# Patient Record
Sex: Female | Born: 1960 | Race: White | Hispanic: No | Smoking: Never smoker
Health system: Southern US, Community
[De-identification: ages and names within clinical notes are randomized; demographics above are authoritative.]

## PROBLEM LIST (undated history)

## (undated) DIAGNOSIS — M1990 Unspecified osteoarthritis, unspecified site: Secondary | ICD-10-CM

## (undated) DIAGNOSIS — G4700 Insomnia, unspecified: Secondary | ICD-10-CM

## (undated) DIAGNOSIS — T7840XA Allergy, unspecified, initial encounter: Secondary | ICD-10-CM

## (undated) DIAGNOSIS — B029 Zoster without complications: Secondary | ICD-10-CM

## (undated) HISTORY — DX: Allergy, unspecified, initial encounter: T78.40XA

## (undated) HISTORY — DX: Zoster without complications: B02.9

## (undated) HISTORY — DX: Unspecified osteoarthritis, unspecified site: M19.90

## (undated) HISTORY — PX: TUBAL LIGATION: SHX77

## (undated) HISTORY — PX: BREAST SURGERY: SHX581

## (undated) HISTORY — DX: Insomnia, unspecified: G47.00

---

## 2002-06-14 ENCOUNTER — Ambulatory Visit (HOSPITAL_COMMUNITY): Admission: RE | Admit: 2002-06-14 | Discharge: 2002-06-14 | Payer: Self-pay | Admitting: Obstetrics and Gynecology

## 2011-01-19 ENCOUNTER — Other Ambulatory Visit: Payer: Self-pay | Admitting: Family Medicine

## 2011-01-19 DIAGNOSIS — M549 Dorsalgia, unspecified: Secondary | ICD-10-CM

## 2011-01-22 ENCOUNTER — Ambulatory Visit
Admission: RE | Admit: 2011-01-22 | Discharge: 2011-01-22 | Disposition: A | Discharge status: Home / Self Care | Payer: BC Managed Care – PPO | Source: Ambulatory Visit | Attending: Family Medicine | Admitting: Family Medicine

## 2011-01-22 DIAGNOSIS — M549 Dorsalgia, unspecified: Secondary | ICD-10-CM

## 2013-10-11 ENCOUNTER — Ambulatory Visit (INDEPENDENT_AMBULATORY_CARE_PROVIDER_SITE_OTHER): Payer: BC Managed Care – PPO | Admitting: Family Medicine

## 2013-10-11 VITALS — BP 130/84 | HR 83 | Temp 97.9°F | Resp 18 | Ht 69.0 in | Wt 152.0 lb

## 2013-10-11 DIAGNOSIS — R350 Frequency of micturition: Secondary | ICD-10-CM

## 2013-10-11 DIAGNOSIS — N390 Urinary tract infection, site not specified: Secondary | ICD-10-CM

## 2013-10-11 DIAGNOSIS — F418 Other specified anxiety disorders: Secondary | ICD-10-CM

## 2013-10-11 DIAGNOSIS — J329 Chronic sinusitis, unspecified: Secondary | ICD-10-CM

## 2013-10-11 DIAGNOSIS — K5900 Constipation, unspecified: Secondary | ICD-10-CM

## 2013-10-11 DIAGNOSIS — F411 Generalized anxiety disorder: Secondary | ICD-10-CM

## 2013-10-11 LAB — POCT URINALYSIS DIPSTICK
BILIRUBIN UA: NEGATIVE
GLUCOSE UA: NEGATIVE
Ketones, UA: NEGATIVE
NITRITE UA: NEGATIVE
RBC UA: NEGATIVE
Spec Grav, UA: 1.01
UROBILINOGEN UA: 0.2
pH, UA: 5.5

## 2013-10-11 LAB — POCT UA - MICROSCOPIC ONLY
CASTS, UR, LPF, POC: NEGATIVE
Crystals, Ur, HPF, POC: NEGATIVE
Mucus, UA: NEGATIVE
RBC, urine, microscopic: NEGATIVE
YEAST UA: NEGATIVE

## 2013-10-11 MED ORDER — LORAZEPAM 0.5 MG PO TABS: 0.5000 mg | ORAL_TABLET | Freq: Two times a day (BID) | ORAL | Status: DC | PRN

## 2013-10-11 MED ORDER — LEVOFLOXACIN 500 MG PO TABS: 500.0000 mg | ORAL_TABLET | Freq: Every day | ORAL | Status: DC

## 2013-10-11 MED ORDER — FLUCONAZOLE 150 MG PO TABS: 150.0000 mg | ORAL_TABLET | Freq: Once | ORAL | Status: DC

## 2013-10-11 MED ORDER — FLUTICASONE PROPIONATE 50 MCG/ACT NA SUSP: 2.0000 | Freq: Every day | NASAL | Status: DC

## 2013-10-11 NOTE — Progress Notes (Signed)
Subjective:  This chart was scribed for Delman Cheadle, MD by Ladene Artist, ED Scribe. The patient was seen in room 5. Patient's care was started at 9:03 AM.   Patient ID: Diane Dean, female    DOB: March 05, 1961, 53 y.o.   MRN: [id number]  Chief Complaint  Patient presents with   Bloated    x 2 weeks   Constipation    x 2 weeks   Urinary Frequency    x 2 weeks   HPI HPI Comments: Diane Dean is a 53 y.o. female who presents to the [facility] complaining of urinary frequency onset 2 weeks ago. Pt reports associated abdominal distention and gradually worsening constipation due to dehydration onset 2 weeks ago. She also reports chills that she attributes to menopause. She has tried Miralax and coffee to relieve constipation.   Pt reports recent elevated blood pressure and wishes to return for a physical.   Pt also reports environmental allergies. She has tried [facility] but states that she does not use it daily.   Pt reports slipped discs in her back with associated pain. Treatments include lumbar steroid injections at [facility]; last injection 1 month ago.   Last pap smear was 2 years ago by Dr. Benjie Karvonen on [place]. Pt reports ovarian cysts.   Past Medical History  Diagnosis Date   Allergy    No current outpatient prescriptions on file prior to visit.   No current facility-administered medications on file prior to visit.   Not on File  Review of Systems  Constitutional: Positive for chills. Negative for fever, fatigue and unexpected weight change.  Eyes: Negative for visual disturbance.  Respiratory: Negative for cough, chest tightness and shortness of breath.   Cardiovascular: Negative for chest pain, palpitations and leg swelling.  Gastrointestinal: Positive for constipation and abdominal distention. Negative for nausea, vomiting and diarrhea.  Genitourinary: Positive for frequency.  Musculoskeletal: Positive for back pain.  Skin: Negative for rash.    Allergic/Immunologic: Positive for environmental allergies.  Neurological: Negative for dizziness, syncope, weakness, light-headedness, numbness and headaches.   Triage Vitals: BP 130/84   Pulse 83   Temp(Src) 97.9 F (36.6 C) (Oral)   Resp 18   Ht 5\' 9"  (1.753 m)   Wt 152 lb (68.947 kg)   BMI 22.44 kg/m2   SpO2 100%    Objective:   Physical Exam  Nursing note and vitals reviewed. Constitutional: She is oriented to person, place, and time. She appears well-developed and well-nourished.  HENT:  Head: Normocephalic and atraumatic.  Right Ear: Tympanic membrane is not injected and not erythematous. A middle ear effusion (mild) is present.  Left Ear: Tympanic membrane is not injected and not erythematous. A middle ear effusion (mild) is present.  Nose: Mucosal edema and rhinorrhea present.  Mouth/Throat: Posterior oropharyngeal erythema present. No oropharyngeal exudate or posterior oropharyngeal edema.  Mild mid edema  Eyes: Conjunctivae and EOM are normal.  Neck: Neck supple.  Cardiovascular: Normal rate, regular rhythm, S1 normal, S2 normal and normal heart sounds.   No murmur heard. Pulmonary/Chest: Effort normal and breath sounds normal.  Abdominal: Bowel sounds are normal. She exhibits no mass. There is generalized tenderness. There is no guarding and negative Murphy's sign.  Musculoskeletal: Normal range of motion.  Lymphadenopathy:       Head (right side): Submental adenopathy present. No submandibular and no tonsillar adenopathy present.       Head (left side): Submental adenopathy present. No submandibular and no  tonsillar adenopathy present.    She has cervical adenopathy (anterior ).       Right: No supraclavicular adenopathy present.       Left: No supraclavicular adenopathy present.  Neurological: She is alert and oriented to person, place, and time.  Skin: Skin is warm and dry.  Psychiatric: She has a normal mood and affect. Her behavior is normal.   Results for orders  placed in visit on 10/11/13  POCT UA - MICROSCOPIC ONLY      Result Value Ref Range   WBC, Ur, HPF, POC tntc     RBC, urine, microscopic neg     Bacteria, U Microscopic trace     Mucus, UA neg     Epithelial cells, urine per micros 1-4     Crystals, Ur, HPF, POC neg     Casts, Ur, LPF, POC neg     Yeast, UA neg    POCT URINALYSIS DIPSTICK      Result Value Ref Range   Color, UA yellow     Clarity, UA clear     Glucose, UA neg     Bilirubin, UA neg     Ketones, UA neg     Spec Grav, UA 1.010     Blood, UA neg     pH, UA 5.5     Protein, UA trace     Urobilinogen, UA 0.2     Nitrite, UA neg     Leukocytes, UA moderate (2+)         Assessment & Plan:   Urinary frequency - Plan: POCT UA - Microscopic Only, POCT urinalysis dipstick  UTI (lower urinary tract infection) - Plan: Urine culture  Situational anxiety  Chronic sinusitis, unspecified location  Unspecified constipation  Meds ordered this encounter  Medications   levofloxacin (LEVAQUIN) 500 MG tablet    Sig: Take 1 tablet (500 mg total) by mouth daily.    Dispense:  7 tablet    Refill:  0   fluconazole (DIFLUCAN) 150 MG tablet    Sig: Take 1 tablet (150 mg total) by mouth once. After antibiotic course is complete. Repeat if needed after 3d    Dispense:  2 tablet    Refill:  0   LORazepam (ATIVAN) 0.5 MG tablet    Sig: Take 1-2 tablets (0.5-1 mg total) by mouth 2 (two) times daily as needed for anxiety.    Dispense:  10 tablet    Refill:  1   fluticasone (FLONASE) 50 MCG/ACT nasal spray    Sig: Place 2 sprays into both nostrils at bedtime.    Dispense:  16 g    Refill:  2    I personally performed the services described in this documentation, which was scribed in my presence. The recorded information has been reviewed and considered, and addended by me as needed.  Delman Cheadle, MD MPH

## 2013-10-11 NOTE — Patient Instructions (Addendum)

## 2013-10-13 LAB — URINE CULTURE

## 2013-10-25 ENCOUNTER — Telehealth: Payer: Self-pay

## 2013-10-25 DIAGNOSIS — R300 Dysuria: Secondary | ICD-10-CM

## 2013-10-25 NOTE — Telephone Encounter (Signed)
Spoke to pt, everything is almost back to normal, but still having mild symptoms.   Per dr Brigitte Pulse: labs only order for ua cx, and micro.  Pt aware, will come in tomorrow morning.

## 2013-10-25 NOTE — Telephone Encounter (Signed)
Pt was seen 10/11/13 and was prescribed an antibiotic for a bladder infection. She is still having issues and would like hear what dr. Brigitte Pulse reccomends.

## 2013-10-26 ENCOUNTER — Other Ambulatory Visit (INDEPENDENT_AMBULATORY_CARE_PROVIDER_SITE_OTHER): Payer: BC Managed Care – PPO | Admitting: Radiology

## 2013-10-26 DIAGNOSIS — R300 Dysuria: Secondary | ICD-10-CM

## 2013-10-26 LAB — POCT UA - MICROSCOPIC ONLY
BACTERIA, U MICROSCOPIC: NEGATIVE
CASTS, UR, LPF, POC: NEGATIVE
CRYSTALS, UR, HPF, POC: NEGATIVE
Mucus, UA: NEGATIVE
WBC, Ur, HPF, POC: NEGATIVE
Yeast, UA: NEGATIVE

## 2013-10-26 LAB — POCT URINALYSIS DIPSTICK
Bilirubin, UA: NEGATIVE
Blood, UA: NEGATIVE
GLUCOSE UA: NEGATIVE
Ketones, UA: NEGATIVE
Leukocytes, UA: NEGATIVE
Nitrite, UA: NEGATIVE
PROTEIN UA: NEGATIVE
SPEC GRAV UA: 1.01
UROBILINOGEN UA: 0.2
pH, UA: 7

## 2013-10-26 NOTE — Progress Notes (Signed)
Pt here for labs only. 

## 2013-10-27 ENCOUNTER — Telehealth: Payer: Self-pay

## 2013-10-27 LAB — URINE CULTURE

## 2013-10-27 NOTE — Telephone Encounter (Signed)
Patient called and left message on lab voicemail for labs.  Called patient, advised her urine culture is not back and we will call her when it comes back.  Patient states understanding and she is doing better.

## 2014-04-07 ENCOUNTER — Ambulatory Visit (INDEPENDENT_AMBULATORY_CARE_PROVIDER_SITE_OTHER): Payer: BC Managed Care – PPO | Admitting: Emergency Medicine

## 2014-04-07 VITALS — BP 118/84 | HR 93 | Temp 97.9°F | Resp 18 | Ht 69.25 in | Wt 154.8 lb

## 2014-04-07 DIAGNOSIS — J209 Acute bronchitis, unspecified: Secondary | ICD-10-CM

## 2014-04-07 MED ORDER — HYDROCOD POLST-CHLORPHEN POLST 10-8 MG/5ML PO LQCR: 5.0000 mL | Freq: Two times a day (BID) | ORAL | Status: DC | PRN

## 2014-04-07 MED ORDER — AZITHROMYCIN 250 MG PO TABS: ORAL_TABLET | ORAL | Status: DC

## 2014-04-07 NOTE — Progress Notes (Signed)
[facility] [address] [place]  [address] [phone]  Date:  04/07/2014   Name:  Diane Dean   DOB:  05-19-1960   MRN:  [id number]  PCP:  Delman Cheadle, MD    Chief Complaint: Cough; Sore Throat; and Referral   History of Present Illness:  Diane Dean is a 54 y.o. very pleasant female patient who presents with the following:  Ill for a week with cough and purulent sputum production. No wheezing or shortness of breath. No nasal congestion or post nasal drainage. No fever or chills No nausea or vomiting No stool change No rash No improvement with over the counter medications or other home remedies.  Denies other complaint or health concern today.   There are no active problems to display for this patient.   Past Medical History  Diagnosis Date  . Allergy     Past Surgical History  Procedure Laterality Date  . Tubal ligation      History  Substance Use Topics  . Smoking status: Never Smoker   . Smokeless tobacco: Not on file  . Alcohol Use: Not on file    Family History  Problem Relation Age of Onset  . Cancer Mother     No Known Allergies  Medication list has been reviewed and updated.  Current Outpatient Prescriptions on File Prior to Visit  Medication Sig Dispense Refill  . fluticasone (FLONASE) 50 MCG/ACT nasal spray Place 2 sprays into both nostrils at bedtime. 16 g 2  . LORazepam (ATIVAN) 0.5 MG tablet Take 1-2 tablets (0.5-1 mg total) by mouth 2 (two) times daily as needed for anxiety. (Patient not taking: Reported on 04/07/2014) 10 tablet 1   No current facility-administered medications on file prior to visit.    Review of Systems:  As per HPI, otherwise negative.    Physical Examination: Filed Vitals:   04/07/14 0836  BP: 118/84  Pulse: 93  Temp: 97.9 F (36.6 C)  Resp: 18   Filed Vitals:   04/07/14 0836  Height: 5' 9.25" (1.759 m)  Weight: 154 lb 12.8 oz (70.217 kg)   Body mass index is 22.69  kg/(m^2). Ideal Body Weight: Weight in (lb) to have BMI = 25: 170.2  GEN: WDWN, NAD, Non-toxic, A & O x 3 HEENT: Atraumatic, Normocephalic. Neck supple. No masses, No LAD. Ears and Nose: No external deformity. CV: RRR, No M/G/R. No JVD. No thrill. No extra heart sounds. PULM: CTA B, no wheezes, crackles, rhonchi. No retractions. No resp. distress. No accessory muscle use. ABD: S, NT, ND, +BS. No rebound. No HSM. EXTR: No c/c/e NEURO Normal gait.  PSYCH: Normally interactive. Conversant. Not depressed or anxious appearing.  Calm demeanor.    Assessment and Plan: Bronchitis tussionex zpak  Signed,  Ellison Carwin, MD

## 2014-04-07 NOTE — Patient Instructions (Signed)
bAcute Bronchitis Bronchitis is inflammation of the airways that extend from the windpipe into the lungs (bronchi). The inflammation often causes mucus to develop. This leads to a cough, which is the most common symptom of bronchitis.  In acute bronchitis, the condition usually develops suddenly and goes away over time, usually in a couple weeks. Smoking, allergies, and asthma can make bronchitis worse. Repeated episodes of bronchitis may cause further lung problems.  CAUSES Acute bronchitis is most often caused by the same virus that causes a cold. The virus can spread from person to person (contagious) through coughing, sneezing, and touching contaminated objects. SIGNS AND SYMPTOMS   Cough.   Fever.   Coughing up mucus.   Body aches.   Chest congestion.   Chills.   Shortness of breath.   Sore throat.  DIAGNOSIS  Acute bronchitis is usually diagnosed through a physical exam. Your health care provider will also ask you questions about your medical history. Tests, such as chest X-rays, are sometimes done to rule out other conditions.  TREATMENT  Acute bronchitis usually goes away in a couple weeks. Oftentimes, no medical treatment is necessary. Medicines are sometimes given for relief of fever or cough. Antibiotic medicines are usually not needed but may be prescribed in certain situations. In some cases, an inhaler may be recommended to help reduce shortness of breath and control the cough. A cool mist vaporizer may also be used to help thin bronchial secretions and make it easier to clear the chest.  HOME CARE INSTRUCTIONS  Get plenty of rest.   Drink enough fluids to keep your urine clear or pale yellow (unless you have a medical condition that requires fluid restriction). Increasing fluids may help thin your respiratory secretions (sputum) and reduce chest congestion, and it will prevent dehydration.   Take medicines only as directed by your health care provider.  If  you were prescribed an antibiotic medicine, finish it all even if you start to feel better.  Avoid smoking and secondhand smoke. Exposure to cigarette smoke or irritating chemicals will make bronchitis worse. If you are a smoker, consider using nicotine gum or skin patches to help control withdrawal symptoms. Quitting smoking will help your lungs heal faster.   Reduce the chances of another bout of acute bronchitis by washing your hands frequently, avoiding people with cold symptoms, and trying not to touch your hands to your mouth, nose, or eyes.   Keep all follow-up visits as directed by your health care provider.  SEEK MEDICAL CARE IF: Your symptoms do not improve after 1 week of treatment.  SEEK IMMEDIATE MEDICAL CARE IF:  You develop an increased fever or chills.   You have chest pain.   You have severe shortness of breath.  You have bloody sputum.   You develop dehydration.  You faint or repeatedly feel like you are going to pass out.  You develop repeated vomiting.  You develop a severe headache. MAKE SURE YOU:   Understand these instructions.  Will watch your condition.  Will get help right away if you are not doing well or get worse. Document Released: 04/22/2004 Document Revised: 07/30/2013 Document Reviewed: 09/05/2012 [facility] Patient Information 2015 [place], [place]. This information is not intended to replace advice given to you by your health care provider. Make sure you discuss any questions you have with your health care provider.

## 2014-07-13 DIAGNOSIS — D126 Benign neoplasm of colon, unspecified: Secondary | ICD-10-CM | POA: Insufficient documentation

## 2014-08-08 ENCOUNTER — Ambulatory Visit (INDEPENDENT_AMBULATORY_CARE_PROVIDER_SITE_OTHER): Payer: BC Managed Care – PPO | Admitting: Family Medicine

## 2014-08-08 ENCOUNTER — Telehealth: Payer: Self-pay

## 2014-08-08 VITALS — BP 122/80 | HR 90 | Temp 98.2°F | Resp 18 | Ht 68.0 in | Wt 147.0 lb

## 2014-08-08 DIAGNOSIS — M1990 Unspecified osteoarthritis, unspecified site: Secondary | ICD-10-CM | POA: Diagnosis not present

## 2014-08-08 DIAGNOSIS — J309 Allergic rhinitis, unspecified: Secondary | ICD-10-CM | POA: Diagnosis not present

## 2014-08-08 DIAGNOSIS — K5900 Constipation, unspecified: Secondary | ICD-10-CM

## 2014-08-08 DIAGNOSIS — R5383 Other fatigue: Secondary | ICD-10-CM

## 2014-08-08 DIAGNOSIS — Z113 Encounter for screening for infections with a predominantly sexual mode of transmission: Secondary | ICD-10-CM

## 2014-08-08 DIAGNOSIS — Z1239 Encounter for other screening for malignant neoplasm of breast: Secondary | ICD-10-CM | POA: Diagnosis not present

## 2014-08-08 DIAGNOSIS — G4700 Insomnia, unspecified: Secondary | ICD-10-CM

## 2014-08-08 DIAGNOSIS — R300 Dysuria: Secondary | ICD-10-CM | POA: Diagnosis not present

## 2014-08-08 DIAGNOSIS — Z1211 Encounter for screening for malignant neoplasm of colon: Secondary | ICD-10-CM | POA: Diagnosis not present

## 2014-08-08 DIAGNOSIS — K5909 Other constipation: Secondary | ICD-10-CM

## 2014-08-08 LAB — POCT URINALYSIS DIPSTICK
BILIRUBIN UA: NEGATIVE
Blood, UA: NEGATIVE
Glucose, UA: NEGATIVE
Ketones, UA: NEGATIVE
LEUKOCYTES UA: NEGATIVE
NITRITE UA: NEGATIVE
Protein, UA: NEGATIVE
Spec Grav, UA: 1.01
UROBILINOGEN UA: 0.2
pH, UA: 7

## 2014-08-08 LAB — COMPREHENSIVE METABOLIC PANEL
ALBUMIN: 4.4 g/dL (ref 3.5–5.2)
ALK PHOS: 77 U/L (ref 39–117)
ALT: 26 U/L (ref 0–35)
AST: 22 U/L (ref 0–37)
BILIRUBIN TOTAL: 0.5 mg/dL (ref 0.2–1.2)
BUN: 8 mg/dL (ref 6–23)
CO2: 21 mEq/L (ref 19–32)
CREATININE: 0.76 mg/dL (ref 0.50–1.10)
Calcium: 9.8 mg/dL (ref 8.4–10.5)
Chloride: 100 mEq/L (ref 96–112)
GLUCOSE: 76 mg/dL (ref 70–99)
Potassium: 4.3 mEq/L (ref 3.5–5.3)
Sodium: 139 mEq/L (ref 135–145)
Total Protein: 7 g/dL (ref 6.0–8.3)

## 2014-08-08 LAB — LIPID PANEL
CHOLESTEROL: 236 mg/dL — AB (ref 0–200)
HDL: 131 mg/dL (ref >=46)
LDL Cholesterol: 89 mg/dL (ref 0–99)
Total CHOL/HDL Ratio: 1.8 Ratio
Triglycerides: 81 mg/dL (ref <150)
VLDL: 16 mg/dL (ref 0–40)

## 2014-08-08 LAB — POCT CBC
Granulocyte percent: 63.7 %G (ref 37–80)
HEMATOCRIT: 44.1 % (ref 37.7–47.9)
Hemoglobin: 14.2 g/dL (ref 12.2–16.2)
Lymph, poc: 2.1 (ref 0.6–3.4)
MCH: 30.7 pg (ref 27–31.2)
MCHC: 32.1 g/dL (ref 31.8–35.4)
MCV: 95.7 fL (ref 80–97)
MID (cbc): 0.5 (ref 0–0.9)
MPV: 8.2 fL (ref 0–99.8)
POC Granulocyte: 4.5 (ref 2–6.9)
POC LYMPH %: 29.8 % (ref 10–50)
POC MID %: 6.5 %M (ref 0–12)
Platelet Count, POC: 250 10*3/uL (ref 142–424)
RBC: 4.61 M/uL (ref 4.04–5.48)
RDW, POC: 17 %
WBC: 7 10*3/uL (ref 4.6–10.2)

## 2014-08-08 LAB — THYROID PANEL WITH TSH
FREE THYROXINE INDEX: 1.5 (ref 1.4–3.8)
T3 Uptake: 28 % (ref 22–35)
T4, Total: 5.3 ug/dL (ref 4.5–12.0)
TSH: 3.144 u[IU]/mL (ref 0.350–4.500)

## 2014-08-08 LAB — HIV ANTIBODY (ROUTINE TESTING W REFLEX): HIV 1&2 Ab, 4th Generation: NONREACTIVE

## 2014-08-08 LAB — POCT UA - MICROSCOPIC ONLY
Bacteria, U Microscopic: NEGATIVE
CRYSTALS, UR, HPF, POC: NEGATIVE
Casts, Ur, LPF, POC: NEGATIVE
Epithelial cells, urine per micros: NEGATIVE
Mucus, UA: NEGATIVE
RBC, urine, microscopic: NEGATIVE
WBC, Ur, HPF, POC: NEGATIVE
YEAST UA: NEGATIVE

## 2014-08-08 LAB — POCT SEDIMENTATION RATE: POCT SED RATE: 125 mm/hr — AB (ref 0–22)

## 2014-08-08 LAB — HEPATITIS C ANTIBODY: HCV AB: NEGATIVE

## 2014-08-08 LAB — VITAMIN B12: Vitamin B-12: 362 pg/mL (ref 211–911)

## 2014-08-08 MED ORDER — AZELASTINE HCL 0.15 % NA SOLN: 2.0000 | Freq: Every day | NASAL | Status: DC

## 2014-08-08 MED ORDER — ALPRAZOLAM 0.5 MG PO TABS: 0.5000 mg | ORAL_TABLET | Freq: Every evening | ORAL | Status: DC | PRN

## 2014-08-08 MED ORDER — LINACLOTIDE 145 MCG PO CAPS: 145.0000 ug | ORAL_CAPSULE | Freq: Every day | ORAL | Status: DC

## 2014-08-08 NOTE — Telephone Encounter (Signed)
Notified pharmacist of this.

## 2014-08-08 NOTE — Telephone Encounter (Signed)
Pharmacist called from [facility] asking about the Xanax prescription. He wants to clarify 90 day supply for Xanax or did you mean 30?  ALPRAZolam (XANAX) 0.5 MG tablet [[id number]]      Order Details    Dose: 0.5 mg Route: Oral Frequency: At bedtime PRN for anxiety   Dispense Quantity:  90 tablet Refills:  1 Fills Remaining:  1          Sig: Take 1 tablet (0.5 mg total) by mouth at bedtime as needed for anxiety.         Written Date:  08/08/14 Expiration Date:  02/04/15     Start Date:  08/08/14 End Date:  --     Ordering Provider:  -- Authorizing Provider:  Shawnee Knapp, MD Ordering User:  Shawnee Knapp, MD

## 2014-08-08 NOTE — Patient Instructions (Addendum)
Constipation  Constipation is when a person has fewer than three bowel movements a week, has difficulty having a bowel movement, or has stools that are dry, hard, or larger than normal. As people grow older, constipation is more common. If you try to fix constipation with medicines that make you have a bowel movement (laxatives), the problem may get worse. Long-term laxative use may cause the muscles of the colon to become weak. A low-fiber diet, not taking in enough fluids, and taking certain medicines may make constipation worse.   CAUSES   · Certain medicines, such as antidepressants, pain medicine, iron supplements, antacids, and water pills.    · Certain diseases, such as diabetes, irritable bowel syndrome (IBS), thyroid disease, or depression.    · Not drinking enough water.    · Not eating enough fiber-rich foods.    · Stress or travel.    · Lack of physical activity or exercise.    · Ignoring the urge to have a bowel movement.    · Using laxatives too much.    SIGNS AND SYMPTOMS   · Having fewer than three bowel movements a week.    · Straining to have a bowel movement.    · Having stools that are hard, dry, or larger than normal.    · Feeling full or bloated.    · Pain in the lower abdomen.    · Not feeling relief after having a bowel movement.    DIAGNOSIS   Your health care provider will take a medical history and perform a physical exam. Further testing may be done for severe constipation. Some tests may include:  · A barium enema X-ray to examine your rectum, colon, and, sometimes, your small intestine.    · A sigmoidoscopy to examine your lower colon.    · A colonoscopy to examine your entire colon.  TREATMENT   Treatment will depend on the severity of your constipation and what is causing it. Some dietary treatments include drinking more fluids and eating more fiber-rich foods. Lifestyle treatments may include regular exercise. If these diet and lifestyle recommendations do not help, your health care  provider may recommend taking over-the-counter laxative medicines to help you have bowel movements. Prescription medicines may be prescribed if over-the-counter medicines do not work.   HOME CARE INSTRUCTIONS   · Eat foods that have a lot of fiber, such as fruits, vegetables, whole grains, and beans.  · Limit foods high in fat and processed sugars, such as french fries, hamburgers, cookies, candies, and soda.    · A fiber supplement may be added to your diet if you cannot get enough fiber from foods.    · Drink enough fluids to keep your urine clear or pale yellow.    · Exercise regularly or as directed by your health care provider.    · Go to the restroom when you have the urge to go. Do not hold it.    · Only take over-the-counter or prescription medicines as directed by your health care provider. Do not take other medicines for constipation without talking to your health care provider first.    SEEK IMMEDIATE MEDICAL CARE IF:   · You have bright red blood in your stool.    · Your constipation lasts for more than 4 days or gets worse.    · You have abdominal or rectal pain.    · You have thin, pencil-like stools.    · You have unexplained weight loss.  MAKE SURE YOU:   · Understand these instructions.  · Will watch your condition.  · Will get help right away if you are not   you have with your health care provider. About Constipation  Constipation Overview Constipation is the most common gastrointestinal complaint - about 4 million Americans experience constipation and make 2.5 million physician visits a year to get help for the problem.  Constipation can occur when the colon absorbs too much water, the colon's muscle  contraction is slow or sluggish, and/or there is delayed transit time through the colon.  The result is stool that is hard and dry.  Indicators of constipation include straining during bowel movements greater than 25% of the time, having fewer than three bowel movements per week, and/or the feeling of incomplete evacuation.  There are established guidelines ([place] II ) for defining constipation. A person needs to have two or more of the following symptoms for at least 12 weeks (not necessarily consecutive) in the preceding 12 months: . Straining in  greater than 25% of bowel movements . Lumpy or hard stools in greater than 25% of bowel movements . Sensation of incomplete emptying in greater than 25% of bowel movements . Sensation of anorectal obstruction/blockade in greater than 25% of bowel movements . Manual maneuvers to help empty greater than 25% of bowel movements (e.g., digital evacuation, support of the pelvic floor)  . Less than  3 bowel movements/week . Loose stools are not present, and criteria for irritable bowel syndrome are insufficient  Common Causes of Constipation . Lack of fiber in your diet . Lack of physical activity . Medications, including iron and calcium supplements  . Dairy intake . Dehydration . Abuse of laxatives  Travel  Irritable Bowel Syndrome  Pregnancy  Luteal phase of menstruation (after ovulation and before menses)  Colorectal problems  Intestinal Dysfunction  Treating Constipation  There are several ways of treating constipation, including changes to diet and exercise, use of laxatives, adjustments to the pelvic floor, and scheduled toileting.  These treatments include: . increasing fiber and fluids in the diet  . increasing physical activity . learning muscle coordination   learning proper toileting techniques and toileting modifications   designing and sticking  to a toileting schedule     2007, [employer] Doc.22

## 2014-08-08 NOTE — Telephone Encounter (Signed)
i meant 90day. If [name] only wants 30 days at a time then that is fine - he is welcome to dispense only a 30d supply if mandated by insurance or pt preference.

## 2014-08-08 NOTE — Progress Notes (Signed)
Subjective:  This chart was scribed for Delman Cheadle MD, by Tamsen Roers, at [facility].  This patient was seen in room 10 and the patient's care was started at 8:38 AM.    Patient ID: Diane Dean, female    DOB: Feb 18, 1961, 54 y.o.   MRN: [id number] Chief Complaint  Patient presents with  . Constipation    on going issue   . ref for colon screen    [facility]  . Stress  . Insomnia     HPI   HPI Comments: Diane Dean is a 54 y.o. female who presents to the [facility] complaining of constipation, stress, and a toe nail fungus.  She does not want to take the muscle relaxor for her back anymore because it is too strong for her.  She also is due for a mammogram which she would like to get a referral for (at [facility]).   Patient states that she lost a lot of weight by quitting drinking beer.  She has not had a period in two years.   Patients husband passed away two years ago.  Patient was able to be sexually active after two years and thinks she may have gotten a urinary tract infection afterwards but denies any discharge or vaginal bleeding at that time or currently.     Constipation: Patient drinks coffee every morning but states that she has been very constipated recently,  and feels like nothing will work for her anymore. She had diarrhea three weeks ago for the first time in 3 years and has been constipated ever since.  She feels like her constipation is due to the stress.  She would like a referral for a colonoscopy at [facility]. Her mother died of colon cancer.    Stress: She states that she would like a prescription for Xanax today. She has not traveled recently but went to [place] in January.    Toe nail fungus: She is complaining of a right big toe nail fungus.    Allergies: Patient is allergic to cats but states that she has 2 at home and they sleep in close proximity to her.  She takes two Advil Cold and Sinus every morning and has not had  any sinus infections since. She is also uses her Flonase but doesn't think it works well for her.  --------------------- Last time I saw her, she was having constipation issues.  She was using Murelax and coffee  Most of her specialists are at [facility].  She is getting lumbar spinal nerve injections for slipped discs and pain. Her gynecologist is Dr. Benjie Karvonen on [address]. When she came in last year I gave her a small amount of low dose Ativan to help with claustrophobia and anxiety on flights.  She was seen last for injection at [facility] June 19, 2014 by Dr. Sheffield Slider for spondylosis of the lumbar region.  BP was 144/90.  They gave her Zanaflex 2 mg to sleep and for severe back pain.  She has not seen anyone other than orthopedics at [facility] for the past 3 years. She did not have any labs completed there.     Past Medical History  Diagnosis Date  . Allergy    Current Outpatient Prescriptions on File Prior to Visit  Medication Sig Dispense Refill  . azithromycin (ZITHROMAX) 250 MG tablet Take 2 tabs PO x 1 dose, then 1 tab PO QD x 4 days (Patient not taking: Reported on 08/08/2014) 6  tablet 0  . chlorpheniramine-HYDROcodone (TUSSIONEX PENNKINETIC ER) 10-8 MG/5ML LQCR Take 5 mLs by mouth every 12 (twelve) hours as needed. (Patient not taking: Reported on 08/08/2014) 60 mL 0  . fluticasone (FLONASE) 50 MCG/ACT nasal spray Place 2 sprays into both nostrils at bedtime. (Patient not taking: Reported on 08/08/2014) 16 g 2  . LORazepam (ATIVAN) 0.5 MG tablet Take 1-2 tablets (0.5-1 mg total) by mouth 2 (two) times daily as needed for anxiety. (Patient not taking: Reported on 04/07/2014) 10 tablet 1   No current facility-administered medications on file prior to visit.    No Known Allergies   Review of Systems  Constitutional: Negative for fever and chills.  Gastrointestinal: Positive for constipation. Negative for nausea, vomiting and abdominal pain.  Genitourinary: Negative for vaginal bleeding, vaginal discharge  and vaginal pain.  Psychiatric/Behavioral: Positive for sleep disturbance.       Objective:   Physical Exam  Constitutional: She appears well-developed and well-nourished. No distress.  HENT:  Head: Normocephalic and atraumatic.  Eyes: Right eye exhibits no discharge. Left eye exhibits no discharge.  Neck: No thyromegaly present.  Cardiovascular: Normal rate, regular rhythm, S1 normal, S2 normal and normal heart sounds.  Exam reveals no friction rub.   No murmur heard. Pulmonary/Chest: Effort normal and breath sounds normal. No respiratory distress. She has no wheezes. She has no rales.  Lymphadenopathy:    She has no cervical adenopathy.  Skin: No rash noted. She is not diaphoretic.  Psychiatric: She has a normal mood and affect. Her behavior is normal.  Nursing note and vitals reviewed.  Filed Vitals:   08/08/14 0810  BP: 122/80  Pulse: 90  Temp: 98.2 F (36.8 C)  TempSrc: Oral  Resp: 18  Height: 5\' 8"  (1.727 m)  Weight: 147 lb (66.679 kg)  SpO2: 98%         Assessment & Plan:   1. Chronic constipation   2. Dysuria   3. Allergic rhinitis, unspecified allergic rhinitis type   4. Insomnia   5. Other fatigue   6. Screening for STD (sexually transmitted disease)   7. Special screening for malignant neoplasms, colon   8. Screening for breast cancer   9. Arthritis     Orders Placed This Encounter  Procedures  . Urine culture  . MM Digital Screening    Standing Status: Future     Number of Occurrences:      Standing Expiration Date: 10/08/2015    Scheduling Instructions:     Would like to be done at [facility] - same day as GI appt would be great if possible    Order Specific Question:  Reason for Exam (SYMPTOM  OR DIAGNOSIS REQUIRED)    Answer:  screening    Order Specific Question:  Is the patient pregnant?    Answer:  No    Order Specific Question:  Preferred imaging location?    Answer:  External  . Comprehensive metabolic panel    Order Specific Question:   Has the patient fasted?    Answer:  Yes  . Lipid panel    Order Specific Question:  Has the patient fasted?    Answer:  Yes  . Vitamin B12  . Vit D  25 hydroxy (rtn osteoporosis monitoring)  . Hepatitis C antibody  . HIV antibody  . Thyroid Panel With TSH  . C-reactive protein  . Rheumatoid factor  . Cyclic citrul peptide antibody, IgG  . Ambulatory referral to Gastroenterology    Referral Priority:  Routine    Referral Type:  Consultation    Referral Reason:  Specialty Services Required    Requested Specialty:  Gastroenterology    Number of Visits Requested:  1  . POCT CBC  . POCT UA - Microscopic Only  . POCT urinalysis dipstick  . POCT SEDIMENTATION RATE    Meds ordered this encounter  Medications  . ALPRAZolam (XANAX) 0.5 MG tablet    Sig: Take 1 tablet (0.5 mg total) by mouth at bedtime as needed for anxiety.    Dispense:  90 tablet    Refill:  1  . Linaclotide (LINZESS) 145 MCG CAPS capsule    Sig: Take 1 capsule (145 mcg total) by mouth daily.    Dispense:  30 capsule    Refill:  11  . Azelastine HCl 0.15 % SOLN    Sig: Place 2 sprays into the nose daily.    Dispense:  30 mL    Refill:  2    I personally performed the services described in this documentation, which was scribed in my presence. The recorded information has been reviewed and considered, and addended by me as needed.  Delman Cheadle, MD MPH

## 2014-08-09 LAB — RHEUMATOID FACTOR: Rhuematoid fact SerPl-aCnc: <10 IU/mL (ref <=14)

## 2014-08-09 LAB — URINE CULTURE: Colony Count: 15000

## 2014-08-09 LAB — CYCLIC CITRUL PEPTIDE ANTIBODY, IGG: Cyclic Citrullin Peptide Ab: <2 U/mL (ref 0.0–5.0)

## 2014-08-09 LAB — C-REACTIVE PROTEIN

## 2014-08-09 LAB — VITAMIN D 25 HYDROXY (VIT D DEFICIENCY, FRACTURES): VIT D 25 HYDROXY: 42 ng/mL (ref 30–100)

## 2014-08-15 ENCOUNTER — Encounter: Payer: Self-pay | Admitting: Family Medicine

## 2014-08-15 DIAGNOSIS — R700 Elevated erythrocyte sedimentation rate: Secondary | ICD-10-CM

## 2014-08-15 DIAGNOSIS — IMO0001 Reserved for inherently not codable concepts without codable children: Secondary | ICD-10-CM

## 2014-11-18 ENCOUNTER — Encounter: Payer: Self-pay | Admitting: *Deleted

## 2015-01-31 ENCOUNTER — Other Ambulatory Visit: Payer: Self-pay | Admitting: Family Medicine

## 2015-02-02 ENCOUNTER — Encounter: Payer: Self-pay | Admitting: Family Medicine

## 2015-02-03 LAB — HM COLONOSCOPY

## 2015-02-04 ENCOUNTER — Encounter: Payer: Self-pay | Admitting: Family Medicine

## 2015-02-12 ENCOUNTER — Other Ambulatory Visit (INDEPENDENT_AMBULATORY_CARE_PROVIDER_SITE_OTHER): Payer: BC Managed Care – PPO

## 2015-02-12 DIAGNOSIS — Q998 Other specified chromosome abnormalities: Secondary | ICD-10-CM | POA: Diagnosis not present

## 2015-02-12 DIAGNOSIS — IMO0001 Reserved for inherently not codable concepts without codable children: Secondary | ICD-10-CM

## 2015-02-12 DIAGNOSIS — R700 Elevated erythrocyte sedimentation rate: Secondary | ICD-10-CM

## 2015-02-12 LAB — C-REACTIVE PROTEIN

## 2015-02-13 LAB — PROCALCITONIN: Procalcitonin: <0.1 ng/mL (ref <0.10)

## 2015-02-13 LAB — ANA: Anti Nuclear Antibody(ANA): NEGATIVE

## 2015-02-13 LAB — SEDIMENTATION RATE: Sed Rate: 4 mm/hr (ref 0–30)

## 2015-02-26 ENCOUNTER — Encounter: Payer: Self-pay | Admitting: Family Medicine

## 2015-06-13 ENCOUNTER — Ambulatory Visit (INDEPENDENT_AMBULATORY_CARE_PROVIDER_SITE_OTHER): Payer: BC Managed Care – PPO | Admitting: Physician Assistant

## 2015-06-13 VITALS — BP 132/88 | HR 89 | Temp 98.0°F | Resp 18 | Ht 68.5 in | Wt 136.0 lb

## 2015-06-13 DIAGNOSIS — J209 Acute bronchitis, unspecified: Secondary | ICD-10-CM | POA: Diagnosis not present

## 2015-06-13 MED ORDER — HYDROCOD POLST-CPM POLST ER 10-8 MG/5ML PO SUER: 5.0000 mL | Freq: Every evening | ORAL | Status: DC | PRN

## 2015-06-13 MED ORDER — GUAIFENESIN ER 1200 MG PO TB12: 1.0000 | ORAL_TABLET | Freq: Two times a day (BID) | ORAL | Status: DC | PRN

## 2015-06-13 MED ORDER — DOXYCYCLINE HYCLATE 100 MG PO CAPS: 100.0000 mg | ORAL_CAPSULE | Freq: Two times a day (BID) | ORAL | Status: AC

## 2015-06-13 MED ORDER — PSEUDOEPHEDRINE HCL 60 MG PO TABS: 60.0000 mg | ORAL_TABLET | Freq: Four times a day (QID) | ORAL | Status: DC | PRN

## 2015-06-13 NOTE — Progress Notes (Signed)
[facility] [address] [place]  [phone]  Date:  06/13/2015   Name:  Diane Dean   DOB:  [birth year]   MRN:  [id number]  PCP:  Delman Cheadle, MD   Chief Complaint  Patient presents with  . Nasal Congestion    x 1 week  . Cough    leading to rib pain  . Sore Throat  . Ear Pain     History of Present Illness:  Diane Dean is a 55 y.o. female patient who presents to [facility] for cc of nasal congestion, cough, sore throat, and otalgia.  Her symptoms began 1 week ago, nasal congestion and hard cough.  Her left ear has been painful since 3 months ago.   She was given zpak 2.5 months which did not help.  She has body aches and chills.  Her throat is sore.  She took advil cold and sinus.   She is coughing productive yellow sputum which has since resolved.  She has no trouble with her breathing.  Yesterday, she took her temperature at 99.5.       There are no active problems to display for this patient.   Past Medical History  Diagnosis Date  . Allergy     Past Surgical History  Procedure Laterality Date  . Tubal ligation    . Breast surgery      Social History  Substance Use Topics  . Smoking status: Never Smoker   . Smokeless tobacco: None  . Alcohol Use: None    Family History  Problem Relation Age of Onset  . Cancer Mother     No Known Allergies  Medication list has been reviewed and updated.  Current Outpatient Prescriptions on File Prior to Visit  Medication Sig Dispense Refill  . ALPRAZolam (XANAX) 0.5 MG tablet TAKE 1 TABLET BY MOUTH EVERY NIGHT AT BEDTIME AS NEEDED FOR ANXIETY 90 tablet 0  . Azelastine HCl 0.15 % SOLN Place 2 sprays into the nose daily. 30 mL 2   No current facility-administered medications on file prior to visit.    ROS ROS otherwise unremarkable unless listed above.   Physical Examination: BP 132/88 mmHg  Pulse 89  Temp(Src) 98 F (36.7 C)  Resp 18  Ht 5' 8.5" (1.74 m)  Wt 136 lb  (61.689 kg)  BMI 20.38 kg/m2  SpO2 98% Ideal Body Weight: Weight in (lb) to have BMI = 25: 166.5  Physical Exam  Constitutional: She is oriented to person, place, and time. She appears well-developed and well-nourished. No distress.  HENT:  Head: Normocephalic and atraumatic.  Right Ear: Tympanic membrane, external ear and ear canal normal.  Left Ear: Tympanic membrane, external ear and ear canal normal.  Nose: Mucosal edema and rhinorrhea present. Right sinus exhibits no maxillary sinus tenderness and no frontal sinus tenderness. Left sinus exhibits no maxillary sinus tenderness and no frontal sinus tenderness.  Mouth/Throat: No uvula swelling. No oropharyngeal exudate, posterior oropharyngeal edema or posterior oropharyngeal erythema.  Eyes: Conjunctivae and EOM are normal. Pupils are equal, round, and reactive to light.  Cardiovascular: Normal rate and regular rhythm.  Exam reveals no gallop, no distant heart sounds and no friction rub.   No murmur heard. Pulmonary/Chest: Effort normal. No respiratory distress. She has no decreased breath sounds. She has no wheezes. She has rhonchi.  Lymphadenopathy:       Head (right side): No submandibular, no tonsillar, no preauricular and no posterior auricular adenopathy present.  Head (left side): No submandibular, no tonsillar, no preauricular and no posterior auricular adenopathy present.  Neurological: She is alert and oriented to person, place, and time.  Skin: Skin is warm and dry. She is not diaphoretic.  Psychiatric: She has a normal mood and affect. Her behavior is normal.     Assessment and Plan: Diane Dean is a 55 y.o. female who is here today for cc of cough, congestion, and sore throat.     Acute bronchitis, unspecified organism - Plan: chlorpheniramine-HYDROcodone (TUSSIONEX PENNKINETIC ER) 10-8 MG/5ML SUER, doxycycline (VIBRAMYCIN) 100 MG capsule, Guaifenesin (MUCINEX MAXIMUM STRENGTH) 1200 MG TB12, pseudoephedrine  (SUDAFED) 60 MG tablet  Ivar Drape, PA-C [facility] 3/17/20172:30 PM

## 2015-06-13 NOTE — Patient Instructions (Addendum)
Please hydrate well with water 64 oz or more.   Please take medication as prescribed.  Continue a nasal saline spray.  Acute Bronchitis Bronchitis is inflammation of the airways that extend from the windpipe into the lungs (bronchi). The inflammation often causes mucus to develop. This leads to a cough, which is the most common symptom of bronchitis.  In acute bronchitis, the condition usually develops suddenly and goes away over time, usually in a couple weeks. Smoking, allergies, and asthma can make bronchitis worse. Repeated episodes of bronchitis may cause further lung problems.  CAUSES Acute bronchitis is most often caused by the same virus that causes a cold. The virus can spread from person to person (contagious) through coughing, sneezing, and touching contaminated objects. SIGNS AND SYMPTOMS   Cough.   Fever.   Coughing up mucus.   Body aches.   Chest congestion.   Chills.   Shortness of breath.   Sore throat.  DIAGNOSIS  Acute bronchitis is usually diagnosed through a physical exam. Your health care provider will also ask you questions about your medical history. Tests, such as chest X-rays, are sometimes done to rule out other conditions.  TREATMENT  Acute bronchitis usually goes away in a couple weeks. Oftentimes, no medical treatment is necessary. Medicines are sometimes given for relief of fever or cough. Antibiotic medicines are usually not needed but may be prescribed in certain situations. In some cases, an inhaler may be recommended to help reduce shortness of breath and control the cough. A cool mist vaporizer may also be used to help thin bronchial secretions and make it easier to clear the chest.  HOME CARE INSTRUCTIONS  Get plenty of rest.   Drink enough fluids to keep your urine clear or pale yellow (unless you have a medical condition that requires fluid restriction). Increasing fluids may help thin your respiratory secretions (sputum) and reduce  chest congestion, and it will prevent dehydration.   Take medicines only as directed by your health care provider.  If you were prescribed an antibiotic medicine, finish it all even if you start to feel better.  Avoid smoking and secondhand smoke. Exposure to cigarette smoke or irritating chemicals will make bronchitis worse. If you are a smoker, consider using nicotine gum or skin patches to help control withdrawal symptoms. Quitting smoking will help your lungs heal faster.   Reduce the chances of another bout of acute bronchitis by washing your hands frequently, avoiding people with cold symptoms, and trying not to touch your hands to your mouth, nose, or eyes.   Keep all follow-up visits as directed by your health care provider.  SEEK MEDICAL CARE IF: Your symptoms do not improve after 1 week of treatment.  SEEK IMMEDIATE MEDICAL CARE IF:  You develop an increased fever or chills.   You have chest pain.   You have severe shortness of breath.  You have bloody sputum.   You develop dehydration.  You faint or repeatedly feel like you are going to pass out.  You develop repeated vomiting.  You develop a severe headache. MAKE SURE YOU:   Understand these instructions.  Will watch your condition.  Will get help right away if you are not doing well or get worse.   This information is not intended to replace advice given to you by your health care provider. Make sure you discuss any questions you have with your health care provider.   Document Released: 04/22/2004 Document Revised: 04/05/2014 Document Reviewed: 09/05/2012 Elsevier Interactive Patient  Education 2016 [employer].

## 2015-06-24 ENCOUNTER — Other Ambulatory Visit: Payer: Self-pay | Admitting: Family Medicine

## 2015-06-25 ENCOUNTER — Telehealth: Payer: Self-pay

## 2015-06-25 MED ORDER — FLUCONAZOLE 150 MG PO TABS: 150.0000 mg | ORAL_TABLET | Freq: Once | ORAL | Status: DC

## 2015-06-25 NOTE — Telephone Encounter (Signed)
Sent in Diflucan

## 2015-06-25 NOTE — Telephone Encounter (Signed)
Pt needs a yeast pill from the medication she was previously taking.  Please advise  [phone]

## 2015-06-26 NOTE — Telephone Encounter (Signed)
Called in.

## 2015-06-26 NOTE — Telephone Encounter (Signed)
Pt advised.

## 2016-02-07 ENCOUNTER — Other Ambulatory Visit: Payer: Self-pay | Admitting: Family Medicine

## 2016-02-18 ENCOUNTER — Encounter: Payer: Self-pay | Admitting: Physician Assistant

## 2016-02-18 ENCOUNTER — Ambulatory Visit (INDEPENDENT_AMBULATORY_CARE_PROVIDER_SITE_OTHER): Payer: BC Managed Care – PPO | Admitting: Physician Assistant

## 2016-02-18 VITALS — BP 118/82 | HR 92 | Temp 98.0°F | Resp 16 | Ht 68.5 in | Wt 144.0 lb

## 2016-02-18 DIAGNOSIS — K5909 Other constipation: Secondary | ICD-10-CM | POA: Insufficient documentation

## 2016-02-18 DIAGNOSIS — F5101 Primary insomnia: Secondary | ICD-10-CM | POA: Diagnosis not present

## 2016-02-18 DIAGNOSIS — M1990 Unspecified osteoarthritis, unspecified site: Secondary | ICD-10-CM | POA: Insufficient documentation

## 2016-02-18 DIAGNOSIS — M5136 Other intervertebral disc degeneration, lumbar region: Secondary | ICD-10-CM | POA: Insufficient documentation

## 2016-02-18 DIAGNOSIS — Z23 Encounter for immunization: Secondary | ICD-10-CM | POA: Diagnosis not present

## 2016-02-18 DIAGNOSIS — Z800 Family history of malignant neoplasm of digestive organs: Secondary | ICD-10-CM | POA: Insufficient documentation

## 2016-02-18 MED ORDER — ZOLPIDEM TARTRATE 10 MG PO TABS: 10.0000 mg | ORAL_TABLET | Freq: Every evening | ORAL | 0 refills | Status: DC | PRN

## 2016-02-18 MED ORDER — LINACLOTIDE 145 MCG PO CAPS: 145.0000 ug | ORAL_CAPSULE | Freq: Every day | ORAL | 5 refills | Status: DC

## 2016-02-18 NOTE — Patient Instructions (Signed)
     IF you received an x-ray today, you will receive an invoice from [facility]. Please contact [facility] at [phone] with questions or concerns regarding your invoice.   IF you received labwork today, you will receive an invoice from [facility]. Please contact [facility] at [phone] with questions or concerns regarding your invoice.   Our billing staff will not be able to assist you with questions regarding bills from these companies.  You will be contacted with the lab results as soon as they are available. The fastest way to get your results is to activate your My Chart account. Instructions are located on the last page of this paperwork. If you have not heard from us regarding the results in 2 weeks, please contact this office.      

## 2016-02-18 NOTE — Progress Notes (Signed)
   Patient ID: Diane Dean, female    DOB: 08-25-1960, 55 y.o.   MRN: [id number]  PCP: Delman Cheadle, MD  Chief Complaint  Patient presents with  . Medication Refill    Linzess and Ambien   . Immunizations    TDap - daughter is pregnant and due soon    Subjective:   Presents for medication refills and Tdap vaccine.  She is managed by multiple specialists at [facility], but needs refills before she can get back there.  Uses Linzess for chronic constipation. Has been able to increase the pregabalin dose as recommended because of the constipation, and she's out of Linzess. There were refills on her bottle, but she only takes it PRN and they have expired.  She uses zolpidem to help her sleep when she travels. Her last bottle lasted years. She will be travelling to [place] next month.  Her daughter is pregnant, due soon, and is demanding that she get a Tdap. She refuses influenza vaccine because her brother had Guillain Barre following influenza vaccine and has persistent effects years later.    Review of Systems  Constitutional: Positive for appetite change (with pregabalin) and unexpected weight change. Negative for chills and fever.  Gastrointestinal: Positive for abdominal pain and constipation. Negative for diarrhea and vomiting.  Musculoskeletal: Positive for arthralgias, back pain and myalgias.  Psychiatric/Behavioral: Positive for sleep disturbance (when travels).       There are no active problems to display for this patient.    Prior to Admission medications   Medication Sig Start Date End Date Taking? Authorizing Provider  pregabalin (LYRICA) 75 MG capsule Take 75 mg by mouth 2 (two) times daily.   Yes Historical Provider, MD     No Known Allergies     Objective:  Physical Exam  Constitutional: She is oriented to person, place, and time. She appears well-developed and well-nourished. She is active and cooperative. No distress.  BP 118/82   Pulse 92   Temp 98 F  (36.7 C) (Oral)   Resp 16   Ht 5' 8.5" (1.74 m)   Wt 144 lb (65.3 kg)   SpO2 99%   BMI 21.58 kg/m     Eyes: Conjunctivae are normal.  Pulmonary/Chest: Effort normal.  Neurological: She is alert and oriented to person, place, and time.  Psychiatric: She has a normal mood and affect. Her speech is normal and behavior is normal.           Assessment & Plan:   1. Chronic constipation - linaclotide (LINZESS) 145 MCG CAPS capsule; Take 1 capsule (145 mcg total) by mouth daily.  Dispense: 30 capsule; Refill: 5  2. Primary insomnia - zolpidem (AMBIEN) 10 MG tablet; Take 1 tablet (10 mg total) by mouth at bedtime as needed for sleep.  Dispense: 30 tablet; Refill: 0  3. Need for Tdap vaccination - Tdap vaccine greater than or equal to 7yo IM   Fara Chute, PA-C [facility] [employer]

## 2016-03-17 ENCOUNTER — Other Ambulatory Visit: Payer: Self-pay | Admitting: Physician Assistant

## 2016-03-17 DIAGNOSIS — F5101 Primary insomnia: Secondary | ICD-10-CM

## 2016-03-19 NOTE — Telephone Encounter (Signed)
Does patient need a refill of Ambien?  States she only uses it sparingly?

## 2016-03-23 NOTE — Telephone Encounter (Signed)
PT ADVISED ITS READY FOR PICK UP

## 2016-03-23 NOTE — Telephone Encounter (Signed)
02/18/16 last ov and refill

## 2016-03-23 NOTE — Telephone Encounter (Signed)
Rx printed at 104. Will bring to 102 after clinic.  Meds ordered this encounter  Medications  . zolpidem (AMBIEN) 10 MG tablet    Sig: TAKE 1 TABLET BY MOUTH EVERY NIGHT AT BEDTIME AS NEEDED FOR SLEEP    Dispense:  30 tablet    Refill:  0

## 2016-04-23 ENCOUNTER — Telehealth: Payer: Self-pay

## 2016-04-23 NOTE — Telephone Encounter (Signed)
PA completed with [facility] for Zolpidem #30 PA# [id number] Dates 04/23/2016-04/24/2019

## 2016-04-23 NOTE — Telephone Encounter (Signed)
Proceed with PA. Would certainly consider a preferred product if there is one.

## 2016-04-23 NOTE — Telephone Encounter (Signed)
Fax req [facility] ambien 10mg  not covered by ins plan  Do you want to change med or we can try to get approved with PA.

## 2016-06-04 ENCOUNTER — Other Ambulatory Visit: Payer: Self-pay | Admitting: Physician Assistant

## 2016-06-04 DIAGNOSIS — F5101 Primary insomnia: Secondary | ICD-10-CM

## 2016-06-06 NOTE — Telephone Encounter (Signed)
02/2016 last refll

## 2016-06-07 NOTE — Telephone Encounter (Signed)
Pt needs an OV with her PCP - in 12/17 it was said she only uses this for travel - she was given #10 pills to get her to her appt.

## 2016-06-07 NOTE — Telephone Encounter (Signed)
Called to [facility] 

## 2016-06-12 NOTE — Progress Notes (Signed)
Subjective:    Patient ID: Diane Dean, female    DOB: 12-06-1960, 56 y.o.   MRN: [id number] Chief Complaint  Patient presents with  . Medication Management    ambien/ talk about medicine about back    HPI  Diane Dean is a delightful 56 yo woman who is here to discuss her insomnia.  She is managed by multiple specialists at [facility]  Insomnia: uses zolpidem to help her sleep when she travels. Her last bottle lasted years.  Her insurance denied it because it was for #30 rather than #12. Has used xanax 0.5 qhs prn but it was causing joint pain.  She does take 1/4 of a tab and then occ has to take a second 1/2 tab during the night.  She has to have 8 hours of sleep to function.   Chronic constipation: on Linzess - can't tolerate the tylenol with codeine as it made bowels much worse, constipation was also make much worse from gabapentin. Had colonoscopy and did show that she had more diverticuli.  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  She does use prn flexeril. Recently had a MRI that was worse.  She has a bottle of tylenol #3 from Oct that she hasn't even opened yet.  Very stiff in the morning.  C-spine ok.   A lot of arthritis in family though no autoimmune.  She had a lot of fatigue with the gabapentin and flexeril though still has to take the latter occasionally when pain worsens.  Was on soma prn prior which she did well on and would like to try again and feels she would be able to use this on a prn basis rather than scheduled.  refuses influenza vaccine because her brother had Guillain Barre following influenza vaccine and has persistent effects years later.  Lipid panel 2 yrs ago   Deviated septum and chronic left ETD and mid ear effusion  Past Medical History:  Diagnosis Date  . Allergy   . Insomnia    Past Surgical History:  Procedure Laterality Date  . BREAST SURGERY    . TUBAL  LIGATION     Current Outpatient Prescriptions on File Prior to Visit  Medication Sig Dispense Refill  . acetaminophen-codeine (TYLENOL #3) 300-30 MG tablet Take by mouth.    . linaclotide (LINZESS) 145 MCG CAPS capsule Take 1 capsule (145 mcg total) by mouth daily. 30 capsule 5  . Pseudoephedrine-Ibuprofen 30-200 MG TABS Take by mouth every morning.    . pregabalin (LYRICA) 75 MG capsule Take 75 mg by mouth 2 (two) times daily.     No current facility-administered medications on file prior to visit.    No Known Allergies Family History  Problem Relation Age of Onset  . Cancer Mother    Social History   Social History  . Marital status: Widowed    Spouse name: [name]  . Number of children: N/A  . Years of education: N/A   Occupational History  . realtor    Social History Main Topics  . Smoking status: Never Smoker  . Smokeless tobacco: Never Used  . Alcohol use Not on file  . Drug use: Unknown  . Sexual activity: Not on file   Other Topics Concern  . Not on file   Social History Narrative  . No narrative on file   Depression screen [facility] 2/9 06/14/2016 06/13/2015  Decreased Interest 0  0  Down, Depressed, Hopeless 0 0  PHQ - 2 Score 0 0    Review of Systems See hpi    Objective:   Physical Exam  Constitutional: She is oriented to person, place, and time. She appears well-developed and well-nourished. No distress.  HENT:  Head: Normocephalic and atraumatic.  Right Ear: External ear normal. Tympanic membrane is retracted. A middle ear effusion is present.  Left Ear: External ear normal.  Nose: Mucosal edema present.  Mouth/Throat: Posterior oropharyngeal erythema (PND) present.  Eyes: Conjunctivae are normal. No scleral icterus.  Neck: Normal range of motion. Neck supple. No thyromegaly present.  Cardiovascular: Normal rate, regular rhythm, normal heart sounds and intact distal pulses.   Pulmonary/Chest: Effort normal and breath sounds normal. No respiratory  distress.  Musculoskeletal: She exhibits no edema.  Lymphadenopathy:    She has no cervical adenopathy.  Neurological: She is alert and oriented to person, place, and time.  Skin: Skin is warm and dry. She is not diaphoretic. No erythema.  Psychiatric: She has a normal mood and affect. Her behavior is normal.     BP 127/87   Pulse 89   Temp 98.3 F (36.8 C) (Oral)   Resp 16   Ht 5\' 8"  (1.727 m)   Wt 145 lb (65.8 kg)   SpO2 98%   BMI 22.05 kg/m   Assessment & Plan:  Start b12 supp  1. Chronic bilateral low back pain with sciatica, sciatica laterality unspecified - could not tolerate flexeril but responded well to soma prior, warned of risk of depedence but pt confident it will be rare prn use for flairs  2. Primary insomnia - got PA for ambien #30/mo - does not use every night but more freq than prior. f/u for OV if needs refill  3. Need for shingles vaccine   4. Chronic constipation - much improved on linzess     Meds ordered this encounter  Medications  . zolpidem (AMBIEN) 10 MG tablet    Sig: Take 1 tablet (10 mg total) by mouth at bedtime as needed. for sleep    Dispense:  30 tablet    Refill:  5  . carisoprodol (SOMA) 350 MG tablet    Sig: Take 1 tablet (350 mg total) by mouth 4 (four) times daily as needed for muscle spasms.    Dispense:  60 tablet    Refill:  1  . Zoster Vac Recomb Adjuvanted (SHINGRIX) 50 MCG SUSR    Sig: Inject 1 Dose into the muscle once.    Dispense:  1 each    Refill:  0     Delman Cheadle, M.D.  Primary Care at [facility] [address] [place], [place] [address] [phone] phone [phone] fax  06/16/16 1:28 AM

## 2016-06-14 ENCOUNTER — Ambulatory Visit (INDEPENDENT_AMBULATORY_CARE_PROVIDER_SITE_OTHER): Payer: BC Managed Care – PPO | Admitting: Family Medicine

## 2016-06-14 VITALS — BP 127/87 | HR 89 | Temp 98.3°F | Resp 16 | Ht 68.0 in | Wt 145.0 lb

## 2016-06-14 DIAGNOSIS — F5101 Primary insomnia: Secondary | ICD-10-CM

## 2016-06-14 DIAGNOSIS — K5909 Other constipation: Secondary | ICD-10-CM | POA: Diagnosis not present

## 2016-06-14 DIAGNOSIS — Z23 Encounter for immunization: Secondary | ICD-10-CM

## 2016-06-14 DIAGNOSIS — G8929 Other chronic pain: Secondary | ICD-10-CM | POA: Diagnosis not present

## 2016-06-14 DIAGNOSIS — M5442 Lumbago with sciatica, left side: Secondary | ICD-10-CM

## 2016-06-14 DIAGNOSIS — M5440 Lumbago with sciatica, unspecified side: Secondary | ICD-10-CM

## 2016-06-14 MED ORDER — CARISOPRODOL 350 MG PO TABS: 350.0000 mg | ORAL_TABLET | Freq: Four times a day (QID) | ORAL | 1 refills | Status: DC | PRN

## 2016-06-14 MED ORDER — ZOLPIDEM TARTRATE 10 MG PO TABS: 10.0000 mg | ORAL_TABLET | Freq: Every evening | ORAL | 5 refills | Status: DC | PRN

## 2016-06-14 MED ORDER — ZOSTER VAC RECOMB ADJUVANTED 50 MCG/0.5ML IM SUSR: 1.0000 | Freq: Once | INTRAMUSCULAR | 0 refills | Status: AC

## 2016-06-14 NOTE — Patient Instructions (Signed)
     IF you received an x-ray today, you will receive an invoice from [facility]. Please contact  Radiology at [phone] with questions or concerns regarding your invoice.   IF you received labwork today, you will receive an invoice from [facility]. Please contact [facility] at [phone] with questions or concerns regarding your invoice.   Our billing staff will not be able to assist you with questions regarding bills from these companies.  You will be contacted with the lab results as soon as they are available. The fastest way to get your results is to activate your My Chart account. Instructions are located on the last page of this paperwork. If you have not heard from us regarding the results in 2 weeks, please contact this office.     

## 2016-12-08 NOTE — Progress Notes (Signed)
Subjective:    Patient ID: Diane Dean, female    DOB: 13-Jul-1960, 56 y.o.   MRN: [id number]   Chief Complaint  Patient presents with  . Medication Refill    ambien and soma    HPI  Diane Dean is a delightful 56 yo woman who is here to discuss her chronic medical conditions.  She is managed by multiple specialists at [facility]  Insomnia: uses zolpidem to help her sleep. Recently, she has begun taking it regularly 10mg  qhs. She has to have 8 hours of sleep to function. Has used xanax 0.5 qhs prn but it was causing joint pain. Will take 1/2 before she falls asleep and will wake and take 1/2 tab in the middle of the night. Likes waking up in the middle of the night to stretch and use the bathroom.   Chronic constipation: on Linzess.   Had colonoscopy and did show that she had more diverticuli.  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 Can't tolerate the gabapentin or tylenol with codeine as it made bowels much worse. She had a lot of fatigue with the gabapentin and prn flexeril though still has to take the latter occasionally when pain worsens.  Was on soma prn prior which she did well on so we tried again on a prn basis rather than scheduled - had #60 of Soma filled 6 mos prior and never refilled.  THey help for when she occ has a pulled muscle but she lost her bottle.  Works when she flies. Had worsening [place] DDD with randiculopathy so had ESI at [facility] 09/15/16.  C-spine ok.   A lot of arthritis in family though no autoimmune.   Going to [employer] on [address] for 37min - walks for an hr and gaining muscle!!  refuses influenza vaccine because her brother had Guillain Barre following influenza vaccine and has persistent effects years later.  Lipid panel 07/2014 was amazing with LDL 89 and HDL 131!!!  Deviated septum and chronic left ETD and mid ear effusion  Colonoscopy done.    Past Medical History:  Diagnosis Date  . Allergy   . Insomnia    Past Surgical History:  Procedure Laterality Date  . BREAST SURGERY    . TUBAL LIGATION     Current Outpatient Prescriptions on File Prior to Visit  Medication Sig Dispense Refill  . acetaminophen-codeine (TYLENOL #3) 300-30 MG tablet Take by mouth.    . carisoprodol (SOMA) 350 MG tablet Take 1 tablet (350 mg total) by mouth 4 (four) times daily as needed for muscle spasms. 60 tablet 1  . linaclotide (LINZESS) 145 MCG CAPS capsule Take 1 capsule (145 mcg total) by mouth daily. 30 capsule 5  . pregabalin (LYRICA) 75 MG capsule Take 75 mg by mouth 2 (two) times daily.    . Pseudoephedrine-Ibuprofen 30-200 MG TABS Take by mouth every morning.    . zolpidem (AMBIEN) 10 MG tablet Take 1 tablet (10 mg total) by mouth at bedtime as needed. for sleep 30 tablet 5   No current facility-administered medications on file prior to visit.    No Known Allergies Family History  Problem Relation Age of Onset  . Cancer Mother    Social History   Social History  . Marital status: Widowed    Spouse name: [name]  . Number of children: N/A  . Years of education: N/A  Occupational History  . realtor    Social History Main Topics  . Smoking status: Never Smoker  . Smokeless tobacco: Never Used  . Alcohol use Not on file  . Drug use: Unknown  . Sexual activity: Not on file   Other Topics Concern  . Not on file   Social History Narrative  . No narrative on file   Depression screen [facility] 2/9 12/09/2016 06/14/2016 06/13/2015  Decreased Interest 0 0 0  Down, Depressed, Hopeless 0 0 0  PHQ - 2 Score 0 0 0    Review of Systems See hpi    Objective:   Physical Exam  Constitutional: She is oriented to person, place, and time. She appears well-developed and well-nourished. No distress.  HENT:  Head: Normocephalic and atraumatic.  Right Ear: External ear normal. Tympanic membrane is retracted. A middle ear effusion is present.   Left Ear: External ear normal.  Nose: Mucosal edema present.  Mouth/Throat: Posterior oropharyngeal erythema (PND) present.  Eyes: Conjunctivae are normal. No scleral icterus.  Neck: Normal range of motion. Neck supple. No thyromegaly present.  Cardiovascular: Normal rate, regular rhythm, normal heart sounds and intact distal pulses.   Pulmonary/Chest: Effort normal and breath sounds normal. No respiratory distress.  Musculoskeletal: She exhibits no edema.  Lymphadenopathy:    She has no cervical adenopathy.  Neurological: She is alert and oriented to person, place, and time.  Skin: Skin is warm and dry. She is not diaphoretic. No erythema.  Psychiatric: She has a normal mood and affect. Her behavior is normal.     BP 120/90 (BP Location: Right Arm, Patient Position: Sitting, Cuff Size: Normal)   Pulse 77   Temp 98.6 F (37 C) (Oral)   Resp 16   Ht 5\' 9"  (1.753 m)   Wt 144 lb 6.4 oz (65.5 kg)   SpO2 100%   BMI 21.32 kg/m   Assessment & Plan:   1. Degenerative disc disease, lumbar   2. Chronic constipation   3. Primary insomnia   4. Screening for breast cancer    Refilled meds. Stable. Recheck in 6 mos for any further refills.  Orders Placed This Encounter  Procedures  . MM SCREENING BREAST TOMO BILATERAL    Standing Status:   Future    Standing Expiration Date:   02/08/2018    Scheduling Instructions:     Please send to [facility]    Order Specific Question:   Reason for Exam (SYMPTOM  OR DIAGNOSIS REQUIRED)    Answer:   screening for breast cancer    Order Specific Question:   Is the patient pregnant?    Answer:   No    Order Specific Question:   Preferred imaging location?    Answer:   External    Comments:   to [facility]   . Pneumococcal polysaccharide vaccine 23-valent greater than or equal to 2yo subcutaneous/IM    Standing Status:   Future    Standing Expiration Date:   12/09/2017    Meds ordered this encounter  Medications  . carisoprodol (SOMA) 350 MG tablet     Sig: Take 1 tablet (350 mg total) by mouth 4 (four) times daily as needed for muscle spasms.    Dispense:  60 tablet    Refill:  1  . linaclotide (LINZESS) 145 MCG CAPS capsule    Sig: Take 1 capsule (145 mcg total) by mouth daily before breakfast.    Dispense:  180 capsule    Refill:  3  Please add refills to current rx.  . zolpidem (AMBIEN) 10 MG tablet    Sig: Take 1 tablet (10 mg total) by mouth at bedtime as needed. for sleep    Dispense:  30 tablet    Refill:  5    Delman Cheadle, M.D.  Primary Care at [facility] [address] [place], [place] [address] [phone] phone [phone] fax  12/10/16 2:10 PM   Delman Cheadle, M.D.  Primary Care at [facility] [address] [place], [place] [address] [phone] phone [phone] fax  12/08/16 5:50 PM

## 2016-12-09 ENCOUNTER — Encounter: Payer: Self-pay | Admitting: Family Medicine

## 2016-12-09 ENCOUNTER — Ambulatory Visit (INDEPENDENT_AMBULATORY_CARE_PROVIDER_SITE_OTHER): Payer: BC Managed Care – PPO | Admitting: Family Medicine

## 2016-12-09 VITALS — BP 120/90 | HR 77 | Temp 98.6°F | Resp 16 | Ht 69.0 in | Wt 144.4 lb

## 2016-12-09 DIAGNOSIS — K5909 Other constipation: Secondary | ICD-10-CM | POA: Diagnosis not present

## 2016-12-09 DIAGNOSIS — F5101 Primary insomnia: Secondary | ICD-10-CM

## 2016-12-09 DIAGNOSIS — Z1239 Encounter for other screening for malignant neoplasm of breast: Secondary | ICD-10-CM

## 2016-12-09 DIAGNOSIS — M5136 Other intervertebral disc degeneration, lumbar region: Secondary | ICD-10-CM | POA: Diagnosis not present

## 2016-12-09 DIAGNOSIS — Z1231 Encounter for screening mammogram for malignant neoplasm of breast: Secondary | ICD-10-CM | POA: Diagnosis not present

## 2016-12-09 MED ORDER — CARISOPRODOL 350 MG PO TABS: 350.0000 mg | ORAL_TABLET | Freq: Four times a day (QID) | ORAL | 1 refills | Status: DC | PRN

## 2016-12-09 MED ORDER — LINACLOTIDE 145 MCG PO CAPS: 145.0000 ug | ORAL_CAPSULE | Freq: Every day | ORAL | 3 refills | Status: DC

## 2016-12-09 MED ORDER — ZOLPIDEM TARTRATE 10 MG PO TABS: 10.0000 mg | ORAL_TABLET | Freq: Every evening | ORAL | 5 refills | Status: DC | PRN

## 2016-12-09 NOTE — Patient Instructions (Addendum)
   IF you received an x-ray today, you will receive an invoice from [facility]. Please contact [facility] at [phone] with questions or concerns regarding your invoice.   IF you received labwork today, you will receive an invoice from [facility]. Please contact [facility] at [phone] with questions or concerns regarding your invoice.   Our billing staff will not be able to assist you with questions regarding bills from these companies.  You will be contacted with the lab results as soon as they are available. The fastest way to get your results is to activate your My Chart account. Instructions are located on the last page of this paperwork. If you have not heard from us regarding the results in 2 weeks, please contact this office.    Insomnia Insomnia is a sleep disorder that makes it difficult to fall asleep or to stay asleep. Insomnia can cause tiredness (fatigue), low energy, difficulty concentrating, mood swings, and poor performance at work or school. There are three different ways to classify insomnia:  Difficulty falling asleep.  Difficulty staying asleep.  Waking up too early in the morning.  Any type of insomnia can be long-term (chronic) or short-term (acute). Both are common. Short-term insomnia usually lasts for three months or less. Chronic insomnia occurs at least three times a week for longer than three months. What are the causes? Insomnia may be caused by another condition, situation, or substance, such as:  Anxiety.  Certain medicines.  Gastroesophageal reflux disease (GERD) or other gastrointestinal conditions.  Asthma or other breathing conditions.  Restless legs syndrome, sleep apnea, or other sleep disorders.  Chronic pain.  Menopause. This may include hot flashes.  Stroke.  Abuse of alcohol, tobacco, or illegal drugs.  Depression.  Caffeine.  Neurological disorders, such as Alzheimer disease.  An overactive thyroid  (hyperthyroidism).  The cause of insomnia may not be known. What increases the risk? Risk factors for insomnia include:  Gender. Women are more commonly affected than men.  Age. Insomnia is more common as you get older.  Stress. This may involve your professional or personal life.  Income. Insomnia is more common in people with lower income.  Lack of exercise.  Irregular work schedule or night shifts.  Traveling between different time zones.  What are the signs or symptoms? If you have insomnia, trouble falling asleep or trouble staying asleep is the main symptom. This may lead to other symptoms, such as:  Feeling fatigued.  Feeling nervous about going to sleep.  Not feeling rested in the morning.  Having trouble concentrating.  Feeling irritable, anxious, or depressed.  How is this treated? Treatment for insomnia depends on the cause. If your insomnia is caused by an underlying condition, treatment will focus on addressing the condition. Treatment may also include:  Medicines to help you sleep.  Counseling or therapy.  Lifestyle adjustments.  Follow these instructions at home:  Take medicines only as directed by your health care provider.  Keep regular sleeping and waking hours. Avoid naps.  Keep a sleep diary to help you and your health care provider figure out what could be causing your insomnia. Include: ? When you sleep. ? When you wake up during the night. ? How well you sleep. ? How rested you feel the next day. ? Any side effects of medicines you are taking. ? What you eat and drink.  Make your bedroom a comfortable place where it is easy to fall asleep: ? Put up shades or special blackout   curtains to block light from outside. ? Use a white noise machine to block noise. ? Keep the temperature cool.  Exercise regularly as directed by your health care provider. Avoid exercising right before bedtime.  Use relaxation techniques to manage stress. Ask  your health care provider to suggest some techniques that may work well for you. These may include: ? Breathing exercises. ? Routines to release muscle tension. ? Visualizing peaceful scenes.  Cut back on alcohol, caffeinated beverages, and cigarettes, especially close to bedtime. These can disrupt your sleep.  Do not overeat or eat spicy foods right before bedtime. This can lead to digestive discomfort that can make it hard for you to sleep.  Limit screen use before bedtime. This includes: ? Watching TV. ? Using your smartphone, tablet, and computer.  Stick to a routine. This can help you fall asleep faster. Try to do a quiet activity, brush your teeth, and go to bed at the same time each night.  Get out of bed if you are still awake after 15 minutes of trying to sleep. Keep the lights down, but try reading or doing a quiet activity. When you feel sleepy, go back to bed.  Make sure that you drive carefully. Avoid driving if you feel very sleepy.  Keep all follow-up appointments as directed by your health care provider. This is important. Contact a health care provider if:  You are tired throughout the day or have trouble in your daily routine due to sleepiness.  You continue to have sleep problems or your sleep problems get worse. Get help right away if:  You have serious thoughts about hurting yourself or someone else. This information is not intended to replace advice given to you by your health care provider. Make sure you discuss any questions you have with your health care provider. Document Released: 03/12/2000 Document Revised: 08/15/2015 Document Reviewed: 12/14/2013 Elsevier Interactive Patient Education  2018 Elsevier Inc.  

## 2017-06-10 ENCOUNTER — Other Ambulatory Visit: Payer: Self-pay

## 2017-06-10 ENCOUNTER — Encounter: Payer: Self-pay | Admitting: Physician Assistant

## 2017-06-10 ENCOUNTER — Ambulatory Visit: Payer: BC Managed Care – PPO | Admitting: Physician Assistant

## 2017-06-10 VITALS — BP 126/84 | HR 96 | Temp 98.3°F | Resp 17 | Ht 69.0 in | Wt 150.6 lb

## 2017-06-10 DIAGNOSIS — B029 Zoster without complications: Secondary | ICD-10-CM

## 2017-06-10 HISTORY — DX: Zoster without complications: B02.9

## 2017-06-10 MED ORDER — OLOPATADINE HCL 0.2 % OP SOLN: 1.0000 [drp] | Freq: Every day | OPHTHALMIC | 12 refills | Status: DC

## 2017-06-10 MED ORDER — VALACYCLOVIR HCL 1 G PO TABS: 1000.0000 mg | ORAL_TABLET | Freq: Three times a day (TID) | ORAL | 0 refills | Status: DC

## 2017-06-10 NOTE — Progress Notes (Deleted)
vaL

## 2017-06-10 NOTE — Progress Notes (Signed)
    06/10/2017 2:38 PM   DOB: 04/27/1960 / MRN: [id number]  SUBJECTIVE:  Diane Dean is a 57 y.o. female presenting for blistering patch of vesicles about the right chest.  It is both itchy and painful.  She is been under a tremendous amount of stress lately.  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  She associates eye itching bilaterally as well as ear itching bilaterally.  The symptoms preceded the rash.  She tells me "I like to have some antibiotic eyedrops on hand."  She has No Known Allergies.   She  has a past medical history of Allergy and Insomnia.    She  reports that  has never smoked. she has never used smokeless tobacco. She  has no sexual activity history on file. The patient  has a past surgical history that includes Tubal ligation and Breast surgery.  Her family history includes Cancer in her mother.  Review of Systems  Constitutional: Negative for chills, diaphoresis and fever.  Gastrointestinal: Negative for nausea.  Skin: Positive for itching and rash.  Neurological: Negative for dizziness.    The problem list and medications were reviewed and updated by myself where necessary and exist elsewhere in the encounter.   OBJECTIVE:  BP 126/84 (BP Location: Left Arm, Patient Position: Sitting, Cuff Size: Normal)   Pulse 96   Temp 98.3 F (36.8 C) (Oral)   Resp 17   Ht 5\' 9"  (1.753 m)   Wt 150 lb 9.6 oz (68.3 kg)   SpO2 97%   BMI 22.24 kg/m   Physical Exam  Constitutional: She is active.  Non-toxic appearance.  Cardiovascular: Normal rate.  Pulmonary/Chest: Effort normal. No tachypnea.  Neurological: She is alert.  Skin: Skin is warm and dry. Rash noted. She is not diaphoretic. No pallor.     Lab Results  Component Value Date   CREATININE 0.76 08/08/2014     No results found for this or any previous visit (from the past 72 hour(s)).  No results found.  ASSESSMENT AND PLAN:  [name] was seen  today for ? shingles.  Diagnoses and all orders for this visit:  Herpes zoster without complication: Very early.  Starting Valtrex 3 times daily. -     valACYclovir (VALTREX) 1000 MG tablet; Take 1 tablet (1,000 mg total) by mouth 3 (three) times daily.  Other orders -     Cancel: MM Digital Screening; Future -     Olopatadine HCl 0.2 % SOLN; Apply 1 drop to eye daily.    The patient is advised to call or return to clinic if she does not see an improvement in symptoms, or to seek the care of the closest emergency department if she worsens with the above plan.   Philis Fendt, MHS, PA-C Primary Care at [facility] 06/10/2017 2:38 PM

## 2017-06-10 NOTE — Patient Instructions (Signed)
     IF you received an x-ray today, you will receive an invoice from [facility]. Please contact [facility] at [phone] with questions or concerns regarding your invoice.   IF you received labwork today, you will receive an invoice from [facility]. Please contact [facility] at [phone] with questions or concerns regarding your invoice.   Our billing staff will not be able to assist you with questions regarding bills from these companies.  You will be contacted with the lab results as soon as they are available. The fastest way to get your results is to activate your My Chart account. Instructions are located on the last page of this paperwork. If you have not heard from us regarding the results in 2 weeks, please contact this office.     

## 2017-07-02 ENCOUNTER — Ambulatory Visit: Payer: BC Managed Care – PPO | Admitting: Family Medicine

## 2017-07-02 ENCOUNTER — Other Ambulatory Visit: Payer: Self-pay

## 2017-07-02 ENCOUNTER — Encounter: Payer: Self-pay | Admitting: Family Medicine

## 2017-07-02 DIAGNOSIS — F5101 Primary insomnia: Secondary | ICD-10-CM

## 2017-07-02 MED ORDER — ZOLPIDEM TARTRATE 10 MG PO TABS: 10.0000 mg | ORAL_TABLET | Freq: Every evening | ORAL | 5 refills | Status: DC | PRN

## 2017-07-02 NOTE — Patient Instructions (Signed)
     IF you received an x-ray today, you will receive an invoice from [facility]. Please contact [facility] at [phone] with questions or concerns regarding your invoice.   IF you received labwork today, you will receive an invoice from [facility]. Please contact [facility] at [phone] with questions or concerns regarding your invoice.   Our billing staff will not be able to assist you with questions regarding bills from these companies.  You will be contacted with the lab results as soon as they are available. The fastest way to get your results is to activate your My Chart account. Instructions are located on the last page of this paperwork. If you have not heard from us regarding the results in 2 weeks, please contact this office.     

## 2017-07-02 NOTE — Progress Notes (Signed)
Subjective:  By signing my name below, I, Diane Dean, attest that this documentation has been prepared under the direction and in the presence of Delman Cheadle, MD. Electronically Signed: Moises Dean, [place]. 07/02/2017 , 11:12 AM .  Patient was seen in Room 1 .   Patient ID: Diane Dean, female    DOB: 1960/05/08, 57 y.o.   MRN: [id number] Chief Complaint  Patient presents with  . Herpes Zoster    right shoulder, pt states rash is gone.  . Follow-up  . Medication Refill    Ambien 10 MG   HPI Diane Dean is a 57 y.o. female who presents to Primary Care at [facility] for follow up and medication refill.   Medication refill Patient requests medication refill of her Ambien. She's doing well on half tablet. She denies taking Linzess or Soma right now.   Shingles - rash She notes the affected areas have improved, but still a little sore when she touches the areas.   Seasonal Allergies She mentions having some fluid in his left ear. She is currently taking Advil cold and sinus.   Health Maintenance She has a mammogram on Tuesday (07/05/17).  She doesn't take flu shots.   Past Medical History:  Diagnosis Date  . Allergy   . Insomnia    Past Surgical History:  Procedure Laterality Date  . BREAST SURGERY    . TUBAL LIGATION     Prior to Admission medications   Medication Sig Start Date End Date Taking? Authorizing Provider  Olopatadine HCl 0.2 % SOLN Apply 1 drop to eye daily. 06/10/17  Yes Tereasa Coop, PA-C  Pseudoephedrine-Ibuprofen 30-200 MG TABS Take by mouth every morning.   Yes [provider]  zolpidem (AMBIEN) 10 MG tablet Take 1 tablet (10 mg total) by mouth at bedtime as needed. for sleep 12/09/16  Yes Shawnee Knapp, MD  carisoprodol (SOMA) 350 MG tablet Take 1 tablet (350 mg total) by mouth 4 (four) times daily as needed for muscle spasms. Patient not taking: Reported on 07/02/2017 12/09/16   Shawnee Knapp, MD  linaclotide [facility]) 145 MCG CAPS capsule  Take 1 capsule (145 mcg total) by mouth daily before breakfast. Patient not taking: Reported on 07/02/2017 12/09/16   Shawnee Knapp, MD  valACYclovir (VALTREX) 1000 MG tablet Take 1 tablet (1,000 mg total) by mouth 3 (three) times daily. Patient not taking: Reported on 07/02/2017 06/10/17   Tereasa Coop, PA-C   No Known Allergies Family History  Problem Relation Age of Onset  . Cancer Mother    Social History   Socioeconomic History  . Marital status: Widowed    Spouse name: [name]  . Number of children: Not on file  . Years of education: Not on file  . Highest education level: Not on file  Occupational History  . Occupation: Cabin crew  Social Needs  . Financial resource strain: Not on file  . Food insecurity:    Worry: Not on file    Inability: Not on file  . Transportation needs:    Medical: Not on file    Non-medical: Not on file  Tobacco Use  . Smoking status: Never Smoker  . Smokeless tobacco: Never Used  Substance and Sexual Activity  . Alcohol use: Not on file  . Drug use: Not on file  . Sexual activity: Not on file  Lifestyle  . Physical activity:    Days per week: Not on file    Minutes per session:  Not on file  . Stress: Not on file  Relationships  . Social connections:    Talks on phone: Not on file    Gets together: Not on file    Attends religious service: Not on file    Active member of club or organization: Not on file    Attends meetings of clubs or organizations: Not on file    Relationship status: Not on file  Other Topics Concern  . Not on file  Social History Narrative  . Not on file   Depression screen [facility] 2/9 07/02/2017 06/10/2017 12/09/2016 06/14/2016 06/13/2015  Decreased Interest 0 0 0 0 0  Down, Depressed, Hopeless 0 0 0 0 0  PHQ - 2 Score 0 0 0 0 0    Review of Systems  Constitutional: Negative for chills, fatigue, fever and unexpected weight change.  Respiratory: Negative for cough.   Gastrointestinal: Negative for constipation, diarrhea,  nausea and vomiting.  Skin: Negative for rash and wound.  Allergic/Immunologic: Positive for environmental allergies.  Neurological: Negative for dizziness, weakness and headaches.       Objective:   Physical Exam  Constitutional: She is oriented to person, place, and time. She appears well-developed and well-nourished. No distress.  HENT:  Head: Normocephalic and atraumatic.  Left Ear: A middle ear effusion is present.  Eyes: Pupils are equal, round, and reactive to light. EOM are normal.  Neck: Neck supple.  Cardiovascular: Normal rate.  Pulmonary/Chest: Effort normal. No respiratory distress.  Musculoskeletal: Normal range of motion.  Neurological: She is alert and oriented to person, place, and time.  Skin: Skin is warm and dry.  Psychiatric: She has a normal mood and affect. Her behavior is normal.  Nursing note and vitals reviewed.   BP 128/88 (BP Location: Left Arm, Patient Position: Sitting, Cuff Size: Normal)   Pulse 90   Temp 98 F (36.7 C) (Oral)   Resp 18   Ht 5\' 9"  (1.753 m)   Wt 148 lb 6.4 oz (67.3 kg)   SpO2 99%   BMI 21.91 kg/m      Assessment & Plan:   1. Primary insomnia   refilled zolpiderm  Meds ordered this encounter  Medications  . zolpidem (AMBIEN) 10 MG tablet    Sig: Take 1 tablet (10 mg total) by mouth at bedtime as needed. for sleep    Dispense:  30 tablet    Refill:  5    I personally performed the services described in this documentation, which was scribed in my presence. The recorded information has been reviewed and considered, and addended by me as needed.   Delman Cheadle, M.D.  Primary Care at [facility] [address] [place], [place] [address] [phone] phone [phone] fax  09/10/17 3:06 AM

## 2017-08-10 LAB — HM MAMMOGRAPHY

## 2017-08-25 ENCOUNTER — Encounter: Payer: Self-pay | Admitting: Family Medicine

## 2017-08-25 ENCOUNTER — Ambulatory Visit: Payer: BC Managed Care – PPO

## 2017-08-25 ENCOUNTER — Other Ambulatory Visit: Payer: Self-pay

## 2017-08-25 ENCOUNTER — Ambulatory Visit: Payer: BC Managed Care – PPO | Admitting: Family Medicine

## 2017-08-25 VITALS — BP 112/76 | HR 86 | Temp 98.2°F | Resp 18 | Ht 69.0 in | Wt 147.4 lb

## 2017-08-25 DIAGNOSIS — M2550 Pain in unspecified joint: Secondary | ICD-10-CM

## 2017-08-25 DIAGNOSIS — K5909 Other constipation: Secondary | ICD-10-CM | POA: Diagnosis not present

## 2017-08-25 DIAGNOSIS — H1010 Acute atopic conjunctivitis, unspecified eye: Secondary | ICD-10-CM | POA: Diagnosis not present

## 2017-08-25 DIAGNOSIS — J3089 Other allergic rhinitis: Secondary | ICD-10-CM

## 2017-08-25 DIAGNOSIS — F5101 Primary insomnia: Secondary | ICD-10-CM

## 2017-08-25 DIAGNOSIS — M25559 Pain in unspecified hip: Secondary | ICD-10-CM

## 2017-08-25 MED ORDER — MELOXICAM 15 MG PO TABS: 15.0000 mg | ORAL_TABLET | Freq: Every day | ORAL | 2 refills | Status: DC | PRN

## 2017-08-25 MED ORDER — MUPIROCIN 2 % EX OINT: 1.0000 "application " | TOPICAL_OINTMENT | Freq: Three times a day (TID) | CUTANEOUS | 1 refills | Status: DC

## 2017-08-25 MED ORDER — PREDNISONE 20 MG PO TABS: ORAL_TABLET | ORAL | 0 refills | Status: DC

## 2017-08-25 MED ORDER — CARISOPRODOL 350 MG PO TABS: 350.0000 mg | ORAL_TABLET | Freq: Four times a day (QID) | ORAL | 1 refills | Status: DC | PRN

## 2017-08-25 NOTE — Progress Notes (Signed)
Subjective:  By signing my name below, I, [name], attest that this documentation has been prepared under the direction and in the presence of Delman Cheadle, MD. Electronically Signed: [name], [place]. 08/25/2017 , 12:54 PM .  Patient was seen in Room 1 .   Patient ID: Diane Dean, female    DOB: 07/13/1960, 57 y.o.   MRN: [id number] Chief Complaint  Patient presents with  . Hip Pain    x2 weeks, pt states she normally gets epidural shot x2 a year. Pt states shot worked on her back but it didn't help her hips. Pt states she been walking and has a trainor.   . Tick Bites    Pt states she has has 3 tick bites this week. Bites were on the hip, glute, and stomach. No rash or fever.   HPI Diane Dean is a 57 y.o. female who presents to Primary Care at [facility] complaining of hip pain for 2 weeks. She notes walking around 10,000 steps each day, roughly around 7 miles. She's been taking ibuprofen for relief. She requests referral to [facility] rheumatology. She informs usually receives epidural injection for temporary relief.   She also mentions pulling off 3 tick bites in the past week, over her right hip, glute and even found one on her stomach last night. She had a negative ANA 2 years ago. She denies any worsening rash around the bites or spreading redness.   She had mammogram done recently, which was normal.   Past Medical History:  Diagnosis Date  . Allergy   . Insomnia    Past Surgical History:  Procedure Laterality Date  . BREAST SURGERY    . TUBAL LIGATION     Prior to Admission medications   Medication Sig Start Date End Date Taking? Authorizing Provider  Olopatadine HCl 0.2 % SOLN Apply 1 drop to eye daily. 06/10/17  Yes Tereasa Coop, PA-C  Pseudoephedrine-Ibuprofen 30-200 MG TABS Take by mouth every morning.   Yes [provider]  zolpidem (AMBIEN) 10 MG tablet Take 1 tablet (10 mg total) by mouth at bedtime as needed. for sleep 07/02/17  Yes Shawnee Knapp, MD  carisoprodol (SOMA) 350 MG tablet Take 1 tablet (350 mg total) by mouth 4 (four) times daily as needed for muscle spasms. Patient not taking: Reported on 07/02/2017 12/09/16   Shawnee Knapp, MD  linaclotide [facility]) 145 MCG CAPS capsule Take 1 capsule (145 mcg total) by mouth daily before breakfast. Patient not taking: Reported on 07/02/2017 12/09/16   Shawnee Knapp, MD   No Known Allergies Family History  Problem Relation Age of Onset  . Cancer Mother    Social History   Socioeconomic History  . Marital status: Widowed    Spouse name: [name]  . Number of children: Not on file  . Years of education: Not on file  . Highest education level: Not on file  Occupational History  . Occupation: Cabin crew  Social Needs  . Financial resource strain: Not on file  . Food insecurity:    Worry: Not on file    Inability: Not on file  . Transportation needs:    Medical: Not on file    Non-medical: Not on file  Tobacco Use  . Smoking status: Never Smoker  . Smokeless tobacco: Never Used  Substance and Sexual Activity  . Alcohol use: Not on file  . Drug use: Not on file  . Sexual activity: Not on file  Lifestyle  .  Physical activity:    Days per week: Not on file    Minutes per session: Not on file  . Stress: Not on file  Relationships  . Social connections:    Talks on phone: Not on file    Gets together: Not on file    Attends religious service: Not on file    Active member of club or organization: Not on file    Attends meetings of clubs or organizations: Not on file    Relationship status: Not on file  Other Topics Concern  . Not on file  Social History Narrative  . Not on file   Depression screen [facility] 2/9 08/25/2017 07/02/2017 06/10/2017 12/09/2016 06/14/2016  Decreased Interest 0 0 0 0 0  Down, Depressed, Hopeless 0 0 0 0 0  PHQ - 2 Score 0 0 0 0 0    Review of Systems  Constitutional: Negative for chills, fatigue, fever and unexpected weight change.  Respiratory: Negative for  cough.   Gastrointestinal: Negative for constipation, diarrhea, nausea and vomiting.  Musculoskeletal: Positive for arthralgias.  Skin: Positive for wound (insect bite). Negative for rash.  Neurological: Negative for dizziness, weakness and headaches.       Objective:   Physical Exam  Constitutional: She is oriented to person, place, and time. She appears well-developed and well-nourished. No distress.  HENT:  Head: Normocephalic and atraumatic.  Eyes: Pupils are equal, round, and reactive to light. EOM are normal.  Neck: Neck supple.  Cardiovascular: Normal rate.  Pulmonary/Chest: Effort normal. No respiratory distress.  Musculoskeletal: Normal range of motion.  Neurological: She is alert and oriented to person, place, and time.  Skin: Skin is warm and dry.  Psychiatric: She has a normal mood and affect. Her behavior is normal.  Nursing note and vitals reviewed.   BP 112/76 (BP Location: Left Arm, Patient Position: Sitting, Cuff Size: Normal)   Pulse 86   Temp 98.2 F (36.8 C) (Oral)   Resp 18   Ht 5\' 9"  (1.753 m)   Wt 147 lb 6.4 oz (66.9 kg)   SpO2 97%   BMI 21.77 kg/m      Assessment & Plan:   1. Pain in joint involving pelvic region and thigh, unspecified laterality - followed closely at [facility] by ortho so pt does not want imaging today of hip and declines UA - requests soma refill as lost her previous bottle - try course of prednisone for acute flair and then continue onto meloxicam for inflammatory suppression after pred course is complete  2. Arthralgia of multiple sites, bilateral - pt requests referral to rheumatologist for further eval - pt requests [facility] where her other specialists are inc ortho.  3. Chronic constipation - doing well on dietary changes so no longer needed Linzess.  4. Primary insomnia - doing well on zolpidem 10 prn - ok to refill when requested  5. Seasonal allergic conjunctivitis   6. Environmental and seasonal allergies     Orders Placed This  Encounter  Procedures  . Ambulatory referral to Rheumatology    Referral Priority:   Routine    Referral Type:   Consultation    Referral Reason:   Specialty Services Required    Requested Specialty:   Rheumatology    Number of Visits Requested:   1    Meds ordered this encounter  Medications  . carisoprodol (SOMA) 350 MG tablet    Sig: Take 1 tablet (350 mg total) by mouth 4 (four) times daily as needed  for muscle spasms.    Dispense:  60 tablet    Refill:  1  . predniSONE (DELTASONE) 20 MG tablet    Sig: Take 3 tabs qd x 3d, then 2 tabs qd x 3d then 1 tab qd x 3d.    Dispense:  18 tablet    Refill:  0  . meloxicam (MOBIC) 15 MG tablet    Sig: Take 1 tablet (15 mg total) by mouth daily as needed for pain.    Dispense:  30 tablet    Refill:  2  . mupirocin ointment (BACTROBAN) 2 %    Sig: Apply 1 application topically 3 (three) times daily.    Dispense:  22 g    Refill:  1    I personally performed the services described in this documentation, which was scribed in my presence. The recorded information has been reviewed and considered, and addended by me as needed.   Delman Cheadle, M.D.  Primary Care at [facility] [address] [place], [place] [address] [phone] phone [phone] fax  09/10/17 2:57 AM

## 2017-08-25 NOTE — Patient Instructions (Addendum)
IF you received an x-ray today, you will receive an invoice from [facility]. Please contact [facility] Radiology at [phone] with questions or concerns regarding your invoice.   IF you received labwork today, you will receive an invoice from [facility]. Please contact [facility] at [phone] with questions or concerns regarding your invoice.   Our billing staff will not be able to assist you with questions regarding bills from these companies.  You will be contacted with the lab results as soon as they are available. The fastest way to get your results is to activate your My Chart account. Instructions are located on the last page of this paperwork. If you have not heard from [place] regarding the results in 2 weeks, please contact this office.     Tick Bite Information, Adult Ticks are insects that draw blood for food. Most ticks live in shrubs and grassy areas. They climb onto people and animals that brush against the leaves and grasses that they rest on. Then they bite, attaching themselves to the skin. Most ticks are harmless, but some ticks carry germs that can spread to a person through a bite and cause a disease. To reduce your risk of getting a disease from a tick bite, it is important to take steps to prevent tick bites. It is also important to check for ticks after being outdoors. If you find that a tick has attached to you, watch for symptoms of disease. How can I prevent tick bites? Take these steps to help prevent tick bites when you are outdoors in an area where ticks are found:  Use insect repellent that has DEET (20% or higher), picaridin, or IR3535 in it. Use it on: ? Skin that is showing. ? The top of your boots. ? Your pant legs. ? Your sleeve cuffs.  For repellent products that contain permethrin, follow product instructions. Use these products on: ? Clothing. ? Gear. ? Boots. ? Tents.  Wear protective clothing. Long sleeves and long pants offer the  best protection from ticks.  Wear light-colored clothing so you can see ticks more easily.  Tuck your pant legs into your socks.  If you go walking on a trail, stay in the middle of the trail so your skin, hair, and clothing do not touch the bushes.  Avoid walking through areas with long grass.  Check for ticks on your clothing, hair, and skin often while you are outside, and check again before you go inside. Make sure to check the places that ticks attach themselves most often. These places include the scalp, neck, armpits, waist, groin, and joint areas. Ticks that carry a disease called Lyme disease have to be attached to the skin for 24-48 hours. Checking for ticks every day will lessen your risk of this and other diseases.  When you come indoors, wash your clothes and take a shower or a bath right away. Dry your clothes in a dryer on high heat for at least 60 minutes. This will kill any ticks in your clothes.  What is the proper way to remove a tick? If you find a tick on your body, remove it as soon as possible. Removing a tick sooner rather than later can prevent germs from passing from the tick to your body. To remove a tick that is crawling on your skin but has not bitten:  Go outdoors and brush the tick off.  Remove the tick with tape or a lint roller.  To remove a tick that  is attached to your skin:  Wash your hands.  If you have latex gloves, put them on.  Use tweezers, curved forceps, or a tick-removal tool to gently grasp the tick as close to your skin and the tick's head as possible.  Gently pull with steady, upward pressure until the tick lets go. When removing the tick: ? Take care to keep the tick's head attached to its body. ? Do not twist or jerk the tick. This can make the tick's head or mouth break off. ? Do not squeeze or crush the tick's body. This could force disease-carrying fluids from the tick into your body.  Do not try to remove a tick with heat,  alcohol, petroleum jelly, or fingernail polish. Using these methods can cause the tick to salivate and regurgitate into your bloodstream, increasing your risk of getting a disease. What should I do after removing a tick?  Clean the bite area with soap and water, rubbing alcohol, or an iodine scrub.  If an antiseptic cream or ointment is available, apply a small amount to the bite site.  Wash and disinfect any instruments that you used to remove the tick. How should I dispose of a tick? To dispose of a live tick, use one of these methods:  Place it in rubbing alcohol.  Place it in a sealed bag or container.  Wrap it tightly in tape.  Flush it down the toilet.  Contact a health care provider if:  You have symptoms of a disease after a tick bite. Symptoms of a tick-borne disease can occur from moments after the tick bites to up to 30 days after a tick is removed. Symptoms include: ? Muscle, joint, or bone pain. ? Difficulty walking or moving your legs. ? Numbness in the legs. ? Paralysis. ? Red rash around the tick bite area that is shaped like a target or a "bull's-eye." ? Redness and swelling in the area of the tick bite. ? Fever. ? Repeated vomiting. ? Diarrhea. ? Weight loss. ? Tender, swollen lymph glands. ? Shortness of breath. ? Cough. ? Pain in the abdomen. ? Headache. ? Abnormal tiredness. ? A change in your level of consciousness. ? Confusion. Get help right away if:  You are not able to remove a tick.  A part of a tick breaks off and gets stuck in your skin.  Your symptoms get worse. Summary  Ticks may carry germs that can spread to a person through a bite and cause disease.  Wear protective clothing and use insect repellent to prevent tick bites. Follow product instructions.  If you find a tick on your body, remove it as soon as possible. If the tick is attached, do not try to remove with heat, alcohol, petroleum jelly, or fingernail polish.  Remove the  attached tick using tweezers, curved forceps, or a tick-removal tool. Gently pull with steady, upward pressure until the tick lets go. Do not twist or jerk the tick. Do not squeeze or crush the tick's body.  If you have symptoms after being bitten by a tick, contact a health care provider. This information is not intended to replace advice given to you by your health care provider. Make sure you discuss any questions you have with your health care provider. Document Released: 03/12/2000 Document Revised: 12/26/2015 Document Reviewed: 12/26/2015 Elsevier Interactive Patient Education  [employer].

## 2017-09-05 ENCOUNTER — Encounter: Payer: Self-pay | Admitting: *Deleted

## 2017-09-10 ENCOUNTER — Encounter: Payer: Self-pay | Admitting: Family Medicine

## 2017-09-10 DIAGNOSIS — F5101 Primary insomnia: Secondary | ICD-10-CM | POA: Insufficient documentation

## 2017-09-10 DIAGNOSIS — J3089 Other allergic rhinitis: Secondary | ICD-10-CM | POA: Insufficient documentation

## 2017-12-26 ENCOUNTER — Other Ambulatory Visit: Payer: Self-pay | Admitting: Family Medicine

## 2017-12-26 DIAGNOSIS — F5101 Primary insomnia: Secondary | ICD-10-CM

## 2017-12-26 NOTE — Telephone Encounter (Signed)
Noted thanks °

## 2017-12-26 NOTE — Telephone Encounter (Signed)
Ambien refill Last Refill:07/02/17 #30 with 5 refills Last OV: 07/02/17 PCP: Dr. Brigitte Pulse

## 2017-12-26 NOTE — Telephone Encounter (Signed)
Please advise on refill. Controlled.  

## 2018-07-03 ENCOUNTER — Other Ambulatory Visit: Payer: Self-pay | Admitting: Family Medicine

## 2018-07-03 DIAGNOSIS — F5101 Primary insomnia: Secondary | ICD-10-CM

## 2018-07-03 NOTE — Telephone Encounter (Signed)
Med is controlled. Prescriber is not at this office. Requested Prescriptions  Refused Prescriptions Disp Refills  . zolpidem (AMBIEN) 10 MG tablet [Pharmacy Med Name: ZOLPIDEM TARTRATE 10 MG TABLET] 30 tablet     Sig: TAKE 1 TABLET BY MOUTH EVERY DAY AT BEDTIME AS NEEDED FOR SLEEP     Not Delegated - Psychiatry:  Anxiolytics/Hypnotics Failed - 07/03/2018  9:08 AM      Failed - This refill cannot be delegated      Failed - Urine Drug Screen completed in last 360 days.      Failed - Valid encounter within last 6 months    Recent Outpatient Visits          10 months ago Pain in joint involving pelvic region and thigh, unspecified laterality   Primary [facility] at [facility], Laurey Arrow, MD   1 year ago Primary insomnia   [facility] Care at [facility], Laurey Arrow, MD   1 year ago Herpes zoster without complication   Primary Care at [place], PA-C   1 year ago Degenerative disc disease, lumbar   Primary Care at [facility], Laurey Arrow, MD   2 years ago Chronic bilateral low back pain with sciatica, sciatica laterality unspecified   Primary Care at [facility], Laurey Arrow, MD

## 2018-07-04 ENCOUNTER — Other Ambulatory Visit: Payer: Self-pay | Admitting: Physician Assistant

## 2018-07-07 ENCOUNTER — Other Ambulatory Visit: Payer: Self-pay | Admitting: Family Medicine

## 2018-07-07 DIAGNOSIS — F5101 Primary insomnia: Secondary | ICD-10-CM

## 2018-07-07 NOTE — Telephone Encounter (Signed)
Requested medication (s) are due for refill today: yes  Requested medication (s) are on the active medication list: yes  Last refill:  12/26/17  Future visit scheduled: yes  Notes to clinic:  Medication not delegated to NT to refill.  TOC appt scheduled-    Requested Prescriptions  Pending Prescriptions Disp Refills   zolpidem (AMBIEN) 10 MG tablet [Pharmacy Med Name: ZOLPIDEM TARTRATE 10 MG TABLET] 30 tablet     Sig: TAKE 1 TABLET BY MOUTH EVERY DAY AT BEDTIME AS NEEDED FOR SLEEP     Not Delegated - Psychiatry:  Anxiolytics/Hypnotics Failed - 07/07/2018 12:44 PM      Failed - This refill cannot be delegated      Failed - Urine Drug Screen completed in last 360 days.      Failed - Valid encounter within last 6 months    Recent Outpatient Visits          10 months ago Pain in joint involving pelvic region and thigh, unspecified laterality   [facility] at [facility], Laurey Arrow, MD   1 year ago Primary insomnia   [facility] Care at [facility], Laurey Arrow, MD   1 year ago Herpes zoster without complication   Primary Care at [place], PA-C   1 year ago Degenerative disc disease, lumbar   Primary Care at [facility], Laurey Arrow, MD   2 years ago Chronic bilateral low back pain with sciatica, sciatica laterality unspecified   Primary Care at [facility], Laurey Arrow, MD      Future Appointments            In 6 days Rutherford Guys, MD Primary Care at [place], [facility]

## 2018-07-13 ENCOUNTER — Other Ambulatory Visit: Payer: Self-pay

## 2018-07-13 ENCOUNTER — Telehealth (INDEPENDENT_AMBULATORY_CARE_PROVIDER_SITE_OTHER): Payer: BC Managed Care – PPO | Admitting: Family Medicine

## 2018-07-13 DIAGNOSIS — D126 Benign neoplasm of colon, unspecified: Secondary | ICD-10-CM

## 2018-07-13 DIAGNOSIS — F5101 Primary insomnia: Secondary | ICD-10-CM

## 2018-07-13 MED ORDER — ZOLPIDEM TARTRATE 10 MG PO TABS: 10.0000 mg | ORAL_TABLET | Freq: Every evening | ORAL | 5 refills | Status: DC | PRN

## 2018-07-13 NOTE — Progress Notes (Signed)
Chief Complaint :   Med refill. Medication has been pend. PHQ 9 score 1 and GAD 7 score 0  GAD 7 Questionnaire Over the last 2 weeks, how often have you been bothered by the following problems?   Not at all sure = 0   Several days = 1   Over half the days =2   Nearly every day =3  1.  Feeling nervous, anxious, or on edge - Not at all sure = 0  2.  Not being able to stop or control worrying - Not at all sure = 0  3.  Worrying too much about different things - Not at all sure = 0  4.  Trouble relaxing - Not at all sure = 0  5.  Being so restless that it's hard to sit still - Not at all sure = 0  6.  Becoming easily annoyed or irritable - Not at all sure = 0  7.  Feeling afraid as if something awful might happen -  Not at all sure = 0  Total Score (add your column scores) =   0         If you checked off any problems, how difficult have these made it for you to do your work, take care of things at home, or get along with other people?  difficult _____________

## 2018-07-13 NOTE — Progress Notes (Signed)
Virtual Visit via telephone Note  I connected with patient on 07/13/18 at 914am  by telephone and verified that I am speaking with the correct person using two identifiers. Diane Dean is currently located at work and patient is currently with her during visit. The provider, Rutherford Guys, MD is located in their office at time of visit.  I discussed the limitations, risks, security and privacy concerns of performing an evaluation and management service by telephone and the availability of in person appointments. I also discussed with the patient that there may be a patient responsible charge related to this service. The patient expressed understanding and agreed to proceed.   Telephone visit today to establish care  HPI Previous PCP Dr Brigitte Pulse Last OV May 2020  Has back pain with disc disease managed with epidural injections twice a year and occ ibuprofen and rare soma  Insomnia well managed with ambien, takes 5mg  at bedtime and other 5mg  middle of night when she wakes up usually to use restroom. It helps her with having a full night sleep without getting too stiff. Denies any side effects, wakes up refreshed  Colonoscopy done at [facility], has polyps, repeat this year  Saw rheum at [place], RA ruled out  obgyn - pap and mammo, dr Benjie Karvonen Widowed, lives alone Has adult children in [place] Works in Scientist, research (life sciences) estate?  Fall Risk  07/13/2018 08/25/2017 07/02/2017 06/10/2017 12/09/2016  Falls in the past year? 0 No No No No  Number falls in past yr: 0 - - - -  Injury with Fall? 0 - - - -  Follow up Falls evaluation completed - - - -     Depression screen [facility] 2/9 07/13/2018 08/25/2017 07/02/2017  Decreased Interest 0 0 0  Down, Depressed, Hopeless 0 0 0  PHQ - 2 Score 0 0 0  Altered sleeping 1 - -  Tired, decreased energy 0 - -  Change in appetite 0 - -  Feeling bad or failure about yourself  0 - -  Trouble concentrating 0 - -  Moving slowly or fidgety/restless 0 - -  Suicidal thoughts 0 - -  PHQ-9  Score 1 - -  Difficult doing work/chores Not difficult at all - -    Allergies  Allergen Reactions  . Meloxicam     Prior to Admission medications   Medication Sig Start Date End Date Taking? Authorizing Provider  carisoprodol (SOMA) 350 MG tablet Take 1 tablet (350 mg total) by mouth 4 (four) times daily as needed for muscle spasms. 08/25/17  Yes Shawnee Knapp, MD  mupirocin ointment (BACTROBAN) 2 % Apply 1 application topically 3 (three) times daily. 08/25/17  Yes Shawnee Knapp, MD  Olopatadine HCl 0.2 % SOLN INSTILL 1 DROP INTO AFFECTED EYE EVERY DAY 07/04/18  Yes Stallings, Zoe A, MD  Pseudoephedrine-Ibuprofen 30-200 MG TABS Take by mouth every morning.   Yes [provider]  zolpidem (AMBIEN) 10 MG tablet TAKE 1 TABLET BY MOUTH AT BEDTIME AS NEEDED FOR SLEEP 12/26/17  Yes Shawnee Knapp, MD  meloxicam (MOBIC) 15 MG tablet Take 1 tablet (15 mg total) by mouth daily as needed for pain. 08/25/17   Shawnee Knapp, MD  predniSONE (DELTASONE) 20 MG tablet Take 3 tabs qd x 3d, then 2 tabs qd x 3d then 1 tab qd x 3d. Patient not taking: Reported on 07/13/2018 08/25/17   Shawnee Knapp, MD    Past Medical History:  Diagnosis Date  . Allergy   .  Arthritis   . Herpes zoster 06/10/2017  . Insomnia     Past Surgical History:  Procedure Laterality Date  . BREAST SURGERY Bilateral    breast implants  . TUBAL LIGATION      Social History   Tobacco Use  . Smoking status: Never Smoker  . Smokeless tobacco: Never Used  Substance Use Topics  . Alcohol use: Not on file    Family History  Problem Relation Age of Onset  . Cancer Mother     ROS Per hpi  Objective  Vitals as reported by the patient: none  There were no vitals filed for this visit.  ASSESSMENT and PLAN  1. Primary insomnia Controlled. Continue current regime.  - zolpidem (AMBIEN) 10 MG tablet; Take 1 tablet (10 mg total) by mouth at bedtime as needed. for sleep  FOLLOW-UP: 6 months   The above assessment and  management plan was discussed with the patient. The patient verbalized understanding of and has agreed to the management plan. Patient is aware to call the clinic if symptoms persist or worsen. Patient is aware when to return to the clinic for a follow-up visit. Patient educated on when it is appropriate to go to the emergency department.    I provided 10 minutes of non-face-to-face time during this encounter.  Rutherford Guys, MD Primary Care at [facility] [place], [place] [address] Ph.  [phone] Fax [phone]

## 2019-01-12 ENCOUNTER — Other Ambulatory Visit: Payer: Self-pay

## 2019-01-12 ENCOUNTER — Encounter: Payer: Self-pay | Admitting: Family Medicine

## 2019-01-12 ENCOUNTER — Ambulatory Visit: Payer: BC Managed Care – PPO | Admitting: Family Medicine

## 2019-01-12 DIAGNOSIS — F5101 Primary insomnia: Secondary | ICD-10-CM

## 2019-01-12 MED ORDER — ZOLPIDEM TARTRATE 10 MG PO TABS: ORAL_TABLET | ORAL | 5 refills | Status: DC

## 2019-01-12 MED ORDER — MUPIROCIN 2 % EX OINT: 1.0000 "application " | TOPICAL_OINTMENT | Freq: Three times a day (TID) | CUTANEOUS | 1 refills | Status: AC

## 2019-01-12 NOTE — Progress Notes (Signed)
10/16/20208:19 AM  Diane Dean [birth year], 58 y.o., female [id number]  Chief Complaint  Patient presents with  . Medication Refill    Lorrin Mais, has concerns about bp    HPI:   Patient is a 58 y.o. female with past medical history significant for insomonia and DDD lumbar spine who presents today for medication refill  Previous PCP Dr Brigitte Pulse Last OV April 2020 - telemedicine  Overall doing well Wilburn Cornelia 5mg  every night at bedtime and another 5mg  when she wakes up around 230am. 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  Does treadmill daily Has been working on [employer] loss and strengthening  Requesting refill of bactroban - has cats  Has no acute concerns today   Depression screen [facility] 2/9 01/12/2019 07/13/2018 08/25/2017  Decreased Interest 0 0 0  Down, Depressed, Hopeless 0 0 0  PHQ - 2 Score 0 0 0  Altered sleeping - 1 -  Tired, decreased energy - 0 -  Change in appetite - 0 -  Feeling bad or failure about yourself  - 0 -  Trouble concentrating - 0 -  Moving slowly or fidgety/restless - 0 -  Suicidal thoughts - 0 -  PHQ-9 Score - 1 -  Difficult doing work/chores - Not difficult at all -    Fall Risk  01/12/2019 07/13/2018 08/25/2017 07/02/2017 06/10/2017  Falls in the past year? 0 0 No No No  Number falls in past yr: 0 0 - - -  Injury with Fall? 0 0 - - -  Follow up - Falls evaluation completed - - -     Allergies  Allergen Reactions  . Meloxicam     Prior to Admission medications   Medication Sig Start Date End Date Taking? Authorizing Provider  mupirocin ointment (BACTROBAN) 2 % Apply 1 application topically 3 (three) times  daily. 08/25/17  Yes Shawnee Knapp, MD  Olopatadine HCl 0.2 % SOLN INSTILL 1 DROP INTO AFFECTED EYE EVERY DAY 07/04/18  Yes Stallings, Zoe A, MD  Pseudoephedrine-Ibuprofen 30-200 MG TABS Take by mouth every morning.   Yes [provider]  zolpidem (AMBIEN) 10 MG tablet TAKE 1 TABLET BY MOUTH EVERY DAY AT BEDTIME AS NEEDED FOR SLEEP 07/13/18  Yes Forrest Moron, MD    Past Medical History:  Diagnosis Date  . Allergy   . Arthritis   . Herpes zoster 06/10/2017  . Insomnia     Past Surgical History:  Procedure Laterality Date  . BREAST SURGERY Bilateral    breast implants  . TUBAL LIGATION      Social History   Tobacco Use  . Smoking status: Never Smoker  . Smokeless tobacco: Never Used  Substance Use Topics  . Alcohol use: Not on file    Family History  Problem Relation Age of Onset  . Cancer Mother     Review of Systems  Constitutional: Negative for chills and fever.  Respiratory: Negative for cough and shortness of breath.   Cardiovascular: Negative for chest pain, palpitations and leg swelling.  Gastrointestinal: Negative for abdominal pain, nausea and vomiting.  per hpi   OBJECTIVE:  Today's Vitals   01/12/19 0812  BP: 120/60  Pulse: 92  Temp: 98.5 F (36.9 C)  SpO2: 96%  Weight: 138 lb 12.8 oz (63 kg)  Height: 5\' 9"  (1.753 m)   Body mass index is 20.5 kg/m.   Physical Exam Vitals signs and nursing note reviewed.  Constitutional:      Appearance: She is well-developed.  HENT:     Head: Normocephalic and atraumatic.     Mouth/Throat:     Pharynx: No oropharyngeal exudate.  Eyes:     General: No scleral icterus.    Conjunctiva/sclera: Conjunctivae normal.     Pupils: Pupils are equal, round, and reactive to light.  Neck:     Musculoskeletal: Neck supple.  Cardiovascular:     Rate and Rhythm: Normal rate and regular rhythm.     Heart sounds: Normal heart sounds. No murmur. No friction rub. No gallop.   Pulmonary:     Effort: Pulmonary  effort is normal.     Breath sounds: Normal breath sounds. No wheezing or rales.  Skin:    General: Skin is warm and dry.  Neurological:     Mental Status: She is alert and oriented to person, place, and time.     No results found for this or any previous visit (from the past 24 hour(s)).  No results found.   ASSESSMENT and PLAN  1. Primary insomnia Controlled. Continue current regime. She is to call for refills when due. - zolpidem (AMBIEN) 10 MG tablet; TAKE 1 TABLET BY MOUTH EVERY DAY AT BEDTIME AS NEEDED FOR SLEEP  Other orders - mupirocin ointment (BACTROBAN) 2 %; Apply 1 application topically 3 (three) times daily.  Return in about 1 year (around 01/12/2020).    Rutherford Guys, MD Primary Care at [facility] [place], [place] [address] Ph.  [phone] Fax [phone]

## 2019-01-12 NOTE — Patient Instructions (Signed)
° ° ° °  If you have lab work done today you will be contacted with your lab results within the next 2 weeks.  If you have not heard from us then please contact us. The fastest way to get your results is to register for My Chart. ° ° °IF you received an x-ray today, you will receive an invoice from [facility]. Please contact [facility] at [phone] with questions or concerns regarding your invoice.  ° °IF you received labwork today, you will receive an invoice from [facility]. Please contact [facility] at [phone] with questions or concerns regarding your invoice.  ° °Our billing staff will not be able to assist you with questions regarding bills from these companies. ° °You will be contacted with the lab results as soon as they are available. The fastest way to get your results is to activate your My Chart account. Instructions are located on the last page of this paperwork. If you have not heard from us regarding the results in 2 weeks, please contact this office. °  ° ° ° °

## 2019-01-23 ENCOUNTER — Telehealth (INDEPENDENT_AMBULATORY_CARE_PROVIDER_SITE_OTHER): Payer: BC Managed Care – PPO | Admitting: Family Medicine

## 2019-01-23 DIAGNOSIS — Z79899 Other long term (current) drug therapy: Secondary | ICD-10-CM | POA: Diagnosis not present

## 2019-01-23 DIAGNOSIS — B351 Tinea unguium: Secondary | ICD-10-CM

## 2019-01-23 DIAGNOSIS — B370 Candidal stomatitis: Secondary | ICD-10-CM | POA: Diagnosis not present

## 2019-01-23 MED ORDER — NYSTATIN 100000 UNIT/ML MT SUSP: 5.0000 mL | Freq: Four times a day (QID) | OROMUCOSAL | 1 refills | Status: DC

## 2019-01-23 MED ORDER — TERBINAFINE HCL 250 MG PO TABS: 250.0000 mg | ORAL_TABLET | Freq: Every day | ORAL | 2 refills | Status: DC

## 2019-01-23 NOTE — Progress Notes (Signed)
   Virtual Visit Note  I connected with patient on 01/23/19 at 340pm by phone and verified that I am speaking with the correct person using two identifiers. Diane Dean is currently located at home and patient is currently with them during visit. The provider, Rutherford Guys, MD is located in their office at time of visit.  I discussed the limitations, risks, security and privacy concerns of performing an evaluation and management service by telephone and the availability of in person appointments. I also discussed with the patient that there may be a patient responsible charge related to this service. The patient expressed understanding and agreed to proceed.   CC: thrush  HPI ? She had been double masking of recent Drinking more ensure Having white patches on her tongue and roof of mouth No reflux Has had thrush before Requesting magic mouth wash She is also requesting oral treatment for onychomycosis  Recently tested negative for covid  Allergies  Allergen Reactions  . Meloxicam     Prior to Admission medications   Medication Sig Start Date End Date Taking? Authorizing Provider  mupirocin ointment (BACTROBAN) 2 % Apply 1 application topically 3 (three) times daily. 01/12/19   Rutherford Guys, MD  Olopatadine HCl 0.2 % SOLN INSTILL 1 DROP INTO AFFECTED EYE EVERY DAY 07/04/18   Delia Chimes A, MD  Pseudoephedrine-Ibuprofen 30-200 MG TABS Take by mouth every morning.    [provider]  zolpidem (AMBIEN) 10 MG tablet TAKE 1 TABLET BY MOUTH EVERY DAY AT BEDTIME AS NEEDED FOR SLEEP 01/12/19   Rutherford Guys, MD    Past Medical History:  Diagnosis Date  . Allergy   . Arthritis   . Herpes zoster 06/10/2017  . Insomnia     Past Surgical History:  Procedure Laterality Date  . BREAST SURGERY Bilateral    breast implants  . TUBAL LIGATION      Social History   Tobacco Use  . Smoking status: Never Smoker  . Smokeless tobacco: Never Used  Substance Use  Topics  . Alcohol use: Not on file    Family History  Problem Relation Age of Onset  . Cancer Mother     ROS Per hpi  Objective  Vitals as reported by the patient: none   ASSESSMENT and PLAN  1. Oral thrush rx for nystatin sent  2. Onychomycosis 3. Encounter for medication management Starting lamisil PO. Reviewed r/se/b. Checks labs in 4 weeks. - Comprehensive metabolic panel; Future  Other orders - nystatin (MYCOSTATIN) 100000 UNIT/ML suspension; Take 5 mLs (500,000 Units total) by mouth 4 (four) times daily. - terbinafine (LAMISIL) 250 MG tablet; Take 1 tablet (250 mg total) by mouth daily.  FOLLOW-UP: prn   The above assessment and management plan was discussed with the patient. The patient verbalized understanding of and has agreed to the management plan. Patient is aware to call the clinic if symptoms persist or worsen. Patient is aware when to return to the clinic for a follow-up visit. Patient educated on when it is appropriate to go to the emergency department.    I provided 13 minutes of non-face-to-face time during this encounter.  Rutherford Guys, MD Primary Care at [place], [place] [address] Ph.  [phone] Fax [phone]

## 2019-01-23 NOTE — Progress Notes (Signed)
Pt is having thrush, says it happens q couple of yrs. She is requesting a rx for [facility] magic mouth wash. Was buying it otc from [place] just to have on hand. Says this time it could be from the use of face mask. Taking cranberry pill  and drinking lots of water for sx.

## 2019-07-16 ENCOUNTER — Other Ambulatory Visit: Payer: Self-pay | Admitting: Family Medicine

## 2019-07-16 DIAGNOSIS — F5101 Primary insomnia: Secondary | ICD-10-CM

## 2019-07-16 NOTE — Telephone Encounter (Signed)
pmp reviewd, appropriate meds refilled 

## 2019-07-16 NOTE — Telephone Encounter (Signed)
Patient is requesting a refill of the following medications: Requested Prescriptions   Pending Prescriptions Disp Refills  . zolpidem (AMBIEN) 10 MG tablet [Pharmacy Med Name: ZOLPIDEM TARTRATE 10 MG TABLET] 30 tablet     Sig: TAKE 1 TABLET BY MOUTH AT BEDTIME AS NEEDED FOR SLEEP    Date of patient request: 07/16/2019 Last office visit: 01/23/2020 Date of last refill: 01/12/2019 Last refill amount: 30 tablets 5 refills Follow up time period per chart: N/A

## 2019-07-16 NOTE — Telephone Encounter (Signed)
Requested medication (s) are due for refill today: yes  Requested medication (s) are on the active medication list: yes  Last refill:  01/12/19  Future visit scheduled: no  Notes to clinic:  medication not delegated to NT to refill   Requested Prescriptions  Pending Prescriptions Disp Refills   zolpidem (AMBIEN) 10 MG tablet [Pharmacy Med Name: ZOLPIDEM TARTRATE 10 MG TABLET] 30 tablet     Sig: TAKE 1 TABLET BY MOUTH AT BEDTIME AS NEEDED FOR SLEEP      Not Delegated - Psychiatry:  Anxiolytics/Hypnotics Failed - 07/16/2019 12:55 PM      Failed - This refill cannot be delegated      Failed - Urine Drug Screen completed in last 360 days.      Passed - Valid encounter within last 6 months    Recent Outpatient Visits           5 months ago Oral thrush   [facility], Lilia Argue, MD   6 months ago Primary insomnia   [facility], Lilia Argue, MD   1 year ago Primary insomnia   [facility], Lilia Argue, MD   1 year ago Pain in joint involving pelvic region and thigh, unspecified laterality   [facility], Laurey Arrow, MD   2 years ago Primary insomnia   [facility], Laurey Arrow, MD

## 2020-02-06 ENCOUNTER — Encounter: Payer: Self-pay | Admitting: *Deleted

## 2020-07-01 ENCOUNTER — Other Ambulatory Visit (HOSPITAL_BASED_OUTPATIENT_CLINIC_OR_DEPARTMENT_OTHER): Payer: Self-pay

## 2020-07-01 ENCOUNTER — Emergency Department (HOSPITAL_BASED_OUTPATIENT_CLINIC_OR_DEPARTMENT_OTHER)
Admission: EM | Admit: 2020-07-01 | Discharge: 2020-07-01 | Disposition: A | Discharge status: Home / Self Care | Payer: BC Managed Care – PPO | Attending: Emergency Medicine | Admitting: Emergency Medicine

## 2020-07-01 ENCOUNTER — Other Ambulatory Visit: Payer: Self-pay

## 2020-07-01 ENCOUNTER — Encounter (HOSPITAL_BASED_OUTPATIENT_CLINIC_OR_DEPARTMENT_OTHER): Payer: Self-pay | Admitting: Emergency Medicine

## 2020-07-01 ENCOUNTER — Emergency Department (HOSPITAL_BASED_OUTPATIENT_CLINIC_OR_DEPARTMENT_OTHER): Payer: BC Managed Care – PPO

## 2020-07-01 DIAGNOSIS — Z8719 Personal history of other diseases of the digestive system: Secondary | ICD-10-CM

## 2020-07-01 DIAGNOSIS — K529 Noninfective gastroenteritis and colitis, unspecified: Secondary | ICD-10-CM | POA: Insufficient documentation

## 2020-07-01 DIAGNOSIS — R1031 Right lower quadrant pain: Secondary | ICD-10-CM | POA: Diagnosis present

## 2020-07-01 LAB — COMPREHENSIVE METABOLIC PANEL
ALT: 17 U/L (ref 0–44)
AST: 21 U/L (ref 15–41)
Albumin: 4.7 g/dL (ref 3.5–5.0)
Alkaline Phosphatase: 65 U/L (ref 38–126)
Anion gap: 11 (ref 5–15)
BUN: 10 mg/dL (ref 6–20)
CO2: 25 mmol/L (ref 22–32)
Calcium: 10.3 mg/dL (ref 8.9–10.3)
Chloride: 99 mmol/L (ref 98–111)
Creatinine, Ser: 0.63 mg/dL (ref 0.44–1.00)
GFR, Estimated: >60 mL/min (ref >60)
Glucose, Bld: 123 mg/dL — ABNORMAL HIGH (ref 70–99)
Potassium: 3.6 mmol/L (ref 3.5–5.1)
Sodium: 135 mmol/L (ref 135–145)
Total Bilirubin: 0.6 mg/dL (ref 0.3–1.2)
Total Protein: 7.6 g/dL (ref 6.5–8.1)

## 2020-07-01 LAB — URINALYSIS, ROUTINE W REFLEX MICROSCOPIC
Bilirubin Urine: NEGATIVE
Glucose, UA: NEGATIVE mg/dL
Hgb urine dipstick: NEGATIVE
Ketones, ur: NEGATIVE mg/dL
Leukocytes,Ua: NEGATIVE
Nitrite: NEGATIVE
Protein, ur: NEGATIVE mg/dL
Specific Gravity, Urine: 1.02 (ref 1.005–1.030)
pH: >9 — ABNORMAL HIGH (ref 5.0–8.0)

## 2020-07-01 LAB — CBC
HCT: 41.6 % (ref 36.0–46.0)
Hemoglobin: 14 g/dL (ref 12.0–15.0)
MCH: 31.2 pg (ref 26.0–34.0)
MCHC: 33.7 g/dL (ref 30.0–36.0)
MCV: 92.7 fL (ref 80.0–100.0)
Platelets: 281 10*3/uL (ref 150–400)
RBC: 4.49 MIL/uL (ref 3.87–5.11)
RDW: 12.8 % (ref 11.5–15.5)
WBC: 10.3 10*3/uL (ref 4.0–10.5)
nRBC: 0 % (ref 0.0–0.2)

## 2020-07-01 LAB — LIPASE, BLOOD: Lipase: 35 U/L (ref 11–51)

## 2020-07-01 MED ORDER — SODIUM CHLORIDE 0.9 % IV BOLUS
1000.0000 mL | Freq: Once | INTRAVENOUS | Status: AC
  Administered 2020-07-01: 1000 mL via INTRAVENOUS

## 2020-07-01 MED ORDER — SODIUM CHLORIDE 0.9 % IV SOLN: INTRAVENOUS | Status: DC

## 2020-07-01 MED ORDER — METRONIDAZOLE 500 MG PO TABS
500.0000 mg | ORAL_TABLET | Freq: Once | ORAL | Status: AC
  Administered 2020-07-01: 500 mg via ORAL
  Filled 2020-07-01: qty 1

## 2020-07-01 MED ORDER — METRONIDAZOLE 500 MG PO TABS
500.0000 mg | ORAL_TABLET | Freq: Three times a day (TID) | ORAL | 0 refills | Status: AC
  Filled 2020-07-01: qty 21, 7d supply, fill #0

## 2020-07-01 MED ORDER — HYDROMORPHONE HCL 2 MG PO TABS
2.0000 mg | ORAL_TABLET | Freq: Four times a day (QID) | ORAL | 0 refills | Status: DC | PRN
  Filled 2020-07-01: qty 20, 5d supply, fill #0

## 2020-07-01 MED ORDER — FLUCONAZOLE 150 MG PO TABS
150.0000 mg | ORAL_TABLET | Freq: Once | ORAL | 0 refills | Status: AC
  Filled 2020-07-01: qty 1, 1d supply, fill #0

## 2020-07-01 MED ORDER — HYDROMORPHONE HCL 1 MG/ML IJ SOLN
1.0000 mg | Freq: Once | INTRAMUSCULAR | Status: AC
  Administered 2020-07-01: 1 mg via INTRAVENOUS
  Filled 2020-07-01: qty 1

## 2020-07-01 MED ORDER — IOHEXOL 300 MG/ML  SOLN
100.0000 mL | Freq: Once | INTRAMUSCULAR | Status: AC | PRN
  Administered 2020-07-01: 100 mL via INTRAVENOUS

## 2020-07-01 MED ORDER — ONDANSETRON 4 MG PO TBDP
4.0000 mg | ORAL_TABLET | Freq: Three times a day (TID) | ORAL | 1 refills | Status: DC | PRN
  Filled 2020-07-01: qty 10, 4d supply, fill #0

## 2020-07-01 MED ORDER — CIPROFLOXACIN HCL 500 MG PO TABS
500.0000 mg | ORAL_TABLET | Freq: Two times a day (BID) | ORAL | 0 refills | Status: AC
  Filled 2020-07-01: qty 14, 7d supply, fill #0

## 2020-07-01 MED ORDER — ONDANSETRON HCL 4 MG/2ML IJ SOLN
4.0000 mg | Freq: Once | INTRAMUSCULAR | Status: AC
  Administered 2020-07-01: 4 mg via INTRAVENOUS
  Filled 2020-07-01: qty 2

## 2020-07-01 MED ORDER — CIPROFLOXACIN HCL 500 MG PO TABS
500.0000 mg | ORAL_TABLET | Freq: Once | ORAL | Status: AC
  Administered 2020-07-01: 500 mg via ORAL
  Filled 2020-07-01 (×2): qty 1

## 2020-07-01 NOTE — ED Notes (Signed)
Patient states she has IBS with constipation and takes a glycerin suppository for BM's daily. Also with history of diverticulitis.

## 2020-07-01 NOTE — ED Provider Notes (Signed)
[facility] EMERGENCY DEPARTMENT Provider Note   CSN: [id number] Arrival date & time: 07/01/20  1036     History Chief Complaint  Patient presents with  . Abdominal Pain    Diane Dean is a 60 y.o. female.  Patient with onset of right lower quadrant abdominal pain at 1 in the morning.  Associated with some vomiting and some diarrhea.  But not frequent episodes of diarrhea no blood in them.  Patient's had a history of diverticulitis in the past.  She is concerned about that being the case.  She has a gastroenterologist in the [facility] area.  Patient also concerned about being dehydrated.        Past Medical History:  Diagnosis Date  . Allergy   . Arthritis   . Herpes zoster 06/10/2017  . Insomnia     Patient Active Problem List   Diagnosis Date Noted  . Primary insomnia 09/10/2017  . Environmental and seasonal allergies 09/10/2017  . Chronic constipation 02/18/2016  . Family history of colon cancer in mother 02/18/2016  . Degenerative disc disease, lumbar 02/18/2016  . Arthritis 02/18/2016  . Serrated adenoma of colon 07/13/2014    Past Surgical History:  Procedure Laterality Date  . BREAST SURGERY Bilateral    breast implants  . TUBAL LIGATION       OB History   No obstetric history on file.     Family History  Problem Relation Age of Onset  . Cancer Mother     Social History   Tobacco Use  . Smoking status: Never Smoker  . Smokeless tobacco: Never Used    Home Medications Prior to Admission medications   Medication Sig Start Date End Date Taking? Authorizing Provider  ciprofloxacin (CIPRO) 500 MG tablet Take 1 tablet (500 mg total) by mouth 2 (two) times daily for 7 days. 07/01/20 07/08/20 Yes Fredia Sorrow, MD  fluconazole (DIFLUCAN) 150 MG tablet Take 1 tablet (150 mg total) by mouth once for 1 dose. 07/01/20 07/01/20 Yes Fredia Sorrow, MD  HYDROmorphone (DILAUDID) 2 MG tablet Take 1 tablet (2 mg total) by mouth every 6  (six) hours as needed for moderate pain or severe pain. 07/01/20  Yes Fredia Sorrow, MD  metroNIDAZOLE (FLAGYL) 500 MG tablet Take 1 tablet (500 mg total) by mouth 3 (three) times daily for 7 days. 07/01/20 07/08/20 Yes Fredia Sorrow, MD  Pseudoephedrine-Ibuprofen 30-200 MG TABS Take by mouth every morning.   Yes [provider]  zolpidem (AMBIEN) 10 MG tablet TAKE 1 TABLET BY MOUTH AT BEDTIME AS NEEDED FOR SLEEP 07/16/19  Yes Jacelyn Pi, Lilia Argue, MD  mupirocin ointment (BACTROBAN) 2 % Apply 1 application topically 3 (three) times daily. 01/12/19   Jacelyn Pi, Lilia Argue, MD  nystatin (MYCOSTATIN) 100000 UNIT/ML suspension Take 5 mLs (500,000 Units total) by mouth 4 (four) times daily. 01/23/19   Jacelyn Pi, Lilia Argue, MD  Olopatadine HCl 0.2 % SOLN INSTILL 1 DROP INTO AFFECTED EYE EVERY DAY 07/04/18   Delia Chimes A, MD  terbinafine (LAMISIL) 250 MG tablet Take 1 tablet (250 mg total) by mouth daily. 01/23/19   Daleen Squibb, MD    Allergies    Meloxicam  Review of Systems   Review of Systems  Constitutional: Negative for chills and fever.  HENT: Negative for rhinorrhea and sore throat.   Eyes: Negative for visual disturbance.  Respiratory: Negative for cough and shortness of breath.   Cardiovascular: Negative for chest pain and leg  swelling.  Gastrointestinal: Positive for abdominal pain, diarrhea, nausea and vomiting. Negative for blood in stool.  Genitourinary: Negative for dysuria.  Musculoskeletal: Negative for back pain and neck pain.  Skin: Negative for rash.  Neurological: Negative for dizziness, light-headedness and headaches.  Hematological: Does not bruise/bleed easily.  Psychiatric/Behavioral: Negative for confusion.    Physical Exam Updated Vital Signs BP (!) 170/97 (BP Location: Left Arm)   Pulse 81   Temp 98.3 F (36.8 C) (Oral)   Resp 18   Ht 1.753 m (5\' 9" )   Wt 64.9 kg   SpO2 97%   BMI 21.12 kg/m   Physical Exam Vitals and nursing note  reviewed.  Constitutional:      General: She is not in acute distress.    Appearance: She is well-developed.  HENT:     Head: Normocephalic and atraumatic.  Eyes:     Extraocular Movements: Extraocular movements intact.     Conjunctiva/sclera: Conjunctivae normal.     Pupils: Pupils are equal, round, and reactive to light.  Cardiovascular:     Rate and Rhythm: Normal rate and regular rhythm.     Heart sounds: No murmur heard.   Pulmonary:     Effort: Pulmonary effort is normal. No respiratory distress.     Breath sounds: Normal breath sounds.  Abdominal:     Palpations: Abdomen is soft.     Tenderness: There is abdominal tenderness.     Comments: Tenderness without guarding right lower quadrant area.  Musculoskeletal:        General: No swelling. Normal range of motion.     Cervical back: Neck supple.  Skin:    General: Skin is warm and dry.  Neurological:     General: No focal deficit present.     Mental Status: She is alert and oriented to person, place, and time.     Cranial Nerves: No cranial nerve deficit.     Sensory: Sensory deficit present.     ED Results / Procedures / Treatments   Labs (all labs ordered are listed, but only abnormal results are displayed) Labs Reviewed  COMPREHENSIVE METABOLIC PANEL - Abnormal; Notable for the following components:      Result Value   Glucose, Bld 123 (*)    All other components within normal limits  URINALYSIS, ROUTINE W REFLEX MICROSCOPIC - Abnormal; Notable for the following components:   pH >9.0 (*)    All other components within normal limits  LIPASE, BLOOD  CBC    EKG None  Radiology CT Abdomen Pelvis W Contrast  Result Date: 07/01/2020 CLINICAL DATA:  Right lower quadrant abdominal pain. EXAM: CT ABDOMEN AND PELVIS WITH CONTRAST TECHNIQUE: Multidetector CT imaging of the abdomen and pelvis was performed using the standard protocol following bolus administration of intravenous contrast. CONTRAST:  179mL OMNIPAQUE  IOHEXOL 300 MG/ML  SOLN COMPARISON:  None. FINDINGS: Lower chest: Unremarkable. Hepatobiliary: 3 mm hypodensity in the inferior right liver is too small to characterize but most likely benign. There is no evidence for gallstones, gallbladder wall thickening, or pericholecystic fluid. No intrahepatic or extrahepatic biliary dilation. Pancreas: No focal mass lesion. No dilatation of the main duct. No intraparenchymal cyst. No peripancreatic edema. Spleen: No splenomegaly. No focal mass lesion. Adrenals/Urinary Tract: No adrenal nodule or mass. Kidneys unremarkable. No evidence for hydroureter. The urinary bladder appears normal for the degree of distention. Stomach/Bowel: Stomach is unremarkable. No gastric wall thickening. No evidence of outlet obstruction. Duodenum is normally positioned as is the ligament  of Treitz. No small bowel wall thickening. No small bowel dilatation. The terminal ileum is normal. The appendix is not well visualized, but there is no edema or inflammation in the region of the cecum. Wall thickening noted in the descending and sigmoid colon with subtle pericolonic edema/inflammation (for example see image 51/axial series 2). Diverticular disease noted in the left colon. Vascular/Lymphatic: No abdominal aortic aneurysm. No abdominal lymphadenopathy. No pelvic lymphadenopathy Reproductive: Unremarkable. Other: No intraperitoneal free fluid. Musculoskeletal: No worrisome lytic or sclerotic osseous abnormality. Degenerative disc disease noted L4-5 and L5-S1 IMPRESSION: 1. Circumferential wall thickening in the descending and sigmoid colon with subtle pericolonic edema/inflammation. Imaging features compatible with infectious/inflammatory colitis. 2. Left colonic diverticulosis. Electronically Signed   By: Misty Stanley M.D.   On: 07/01/2020 14:14    Procedures Procedures   Medications Ordered in ED Medications  0.9 %  sodium chloride infusion (has no administration in time range)  0.9 %   sodium chloride infusion (has no administration in time range)  ciprofloxacin (CIPRO) tablet 500 mg (has no administration in time range)  metroNIDAZOLE (FLAGYL) tablet 500 mg (has no administration in time range)  iohexol (OMNIPAQUE) 300 MG/ML solution 100 mL (100 mLs Intravenous Contrast Given 07/01/20 1321)  ondansetron (ZOFRAN) injection 4 mg (4 mg Intravenous Given 07/01/20 1406)  HYDROmorphone (DILAUDID) injection 1 mg (1 mg Intravenous Given 07/01/20 1410)  sodium chloride 0.9 % bolus 1,000 mL (1,000 mLs Intravenous New Bag/Given 07/01/20 1406)    ED Course  I have reviewed the triage vital signs and the nursing notes.  Pertinent labs & imaging results that were available during my care of the patient were reviewed by me and considered in my medical decision making (see chart for details).    MDM Rules/Calculators/A&P                          Patient feeling better after IV fluids and pain medication antinausea medicine.  CT scan of the abdomen consistent with colitis in the sigmoid colon and descending colon area.  Does have evidence of diverticulosis no distinct evidence of diverticulitis.  But patient clinically is very concerned that is what she is got.  So probably will treat with both Cipro and Flagyl.  Even though CT scan more consistent with colitis.  Patient's appendix not well visualized but no inflammation around the cecum where the appendix area.  Patient gets yeast infections easily so we will also treat with Diflucan hydromorphone orally for the pain.  And Zofran for the nausea and vomiting.  Patient will follow up with her gastroenterologist.     Final Clinical Impression(s) / ED Diagnoses Final diagnoses:  Colitis  History of diverticulitis    Rx / DC Orders ED Discharge Orders         Ordered    ciprofloxacin (CIPRO) 500 MG tablet  2 times daily        07/01/20 1507    metroNIDAZOLE (FLAGYL) 500 MG tablet  3 times daily        07/01/20 1507    fluconazole  (DIFLUCAN) 150 MG tablet   Once        07/01/20 1507    HYDROmorphone (DILAUDID) 2 MG tablet  Every 6 hours PRN        07/01/20 1507           Fredia Sorrow, MD 07/01/20 1515

## 2020-07-01 NOTE — ED Triage Notes (Signed)
Pt presents with complaints of RLQ pain since last night.

## 2020-07-01 NOTE — Discharge Instructions (Signed)
Follow-up with your gastroenterologist in the next few days.  Call and make an appointment.  Take the antibiotics Cipro and Flagyl as directed over the next 7 days.  Take the hydromorphone as needed for pain.  Take the Diflucan to prevent yeast infection.  Would expect improvement over the next 2 days.  Return for any new or worse symptoms.

## 2020-09-17 ENCOUNTER — Other Ambulatory Visit (HOSPITAL_BASED_OUTPATIENT_CLINIC_OR_DEPARTMENT_OTHER): Payer: Self-pay

## 2021-10-15 ENCOUNTER — Telehealth: Payer: BC Managed Care – PPO

## 2021-10-16 ENCOUNTER — Ambulatory Visit (HOSPITAL_COMMUNITY)
Admission: RE | Admit: 2021-10-16 | Discharge: 2021-10-16 | Disposition: A | Discharge status: Home / Self Care | Payer: BC Managed Care – PPO | Source: Ambulatory Visit | Attending: Emergency Medicine | Admitting: Emergency Medicine

## 2021-10-16 ENCOUNTER — Encounter (HOSPITAL_COMMUNITY): Payer: Self-pay

## 2021-10-16 VITALS — BP 152/88 | HR 77 | Temp 98.3°F | Resp 16

## 2021-10-16 DIAGNOSIS — K5900 Constipation, unspecified: Secondary | ICD-10-CM

## 2021-10-16 DIAGNOSIS — F5101 Primary insomnia: Secondary | ICD-10-CM

## 2021-10-16 DIAGNOSIS — Z760 Encounter for issue of repeat prescription: Secondary | ICD-10-CM

## 2021-10-16 DIAGNOSIS — G4700 Insomnia, unspecified: Secondary | ICD-10-CM | POA: Diagnosis not present

## 2021-10-16 MED ORDER — LINACLOTIDE 145 MCG PO CAPS: 145.0000 ug | ORAL_CAPSULE | Freq: Every day | ORAL | 1 refills | Status: AC

## 2021-10-16 MED ORDER — ZOLPIDEM TARTRATE 10 MG PO TABS: ORAL_TABLET | ORAL | 1 refills | Status: DC

## 2021-10-16 NOTE — ED Provider Notes (Signed)
[name]: [id number] Arrival date & time: 10/16/21  1047      History   Chief Complaint Chief Complaint  Patient presents with   Medication Refill    My doctor, Delman Cheadle has taken some time off.  I need a refil on my Zolpdem Tartrate (ambien)thank you, [name] - Entered by patient    HPI Diane Dean is a 61 y.o. female.   Patient presents requesting medication refill for Ambien and Linzess.  Has been taking Ambien daily for years due to insomnia and has been taking Linzess as for constipation.  Endorses that her primary doctor has taken a 1 year leave and was the only provider at the practice.  Has attempted to find a new doctor and is requesting assistance.    Past Medical History:  Diagnosis Date   Allergy    Arthritis    Herpes zoster 06/10/2017   Insomnia     Patient Active Problem List   Diagnosis Date Noted   Primary insomnia 09/10/2017   Environmental and seasonal allergies 09/10/2017   Chronic constipation 02/18/2016   Family history of colon cancer in mother 02/18/2016   Degenerative disc disease, lumbar 02/18/2016   Arthritis 02/18/2016   Serrated adenoma of colon 07/13/2014    Past Surgical History:  Procedure Laterality Date   BREAST SURGERY Bilateral    breast implants   TUBAL LIGATION      OB History   No obstetric history on file.      Home Medications    Prior to Admission medications   Medication Sig Start Date End Date Taking? Authorizing Provider  HYDROmorphone (DILAUDID) 2 MG tablet Take 1 tablet (2 mg total) by mouth every 6 (six) hours as needed for moderate pain or severe pain. 07/01/20   Fredia Sorrow, MD  mupirocin ointment (BACTROBAN) 2 % Apply 1 application topically 3 (three) times daily. 01/12/19   Jacelyn Pi, Lilia Argue, MD  nystatin (MYCOSTATIN) 100000 UNIT/ML suspension Take 5 mLs (500,000 Units total) by mouth 4 (four) times daily. 01/23/19   Jacelyn Pi, Lilia Argue, MD  Olopatadine HCl  0.2 % SOLN INSTILL 1 DROP INTO AFFECTED EYE EVERY DAY 07/04/18   Delia Chimes A, MD  ondansetron (ZOFRAN ODT) 4 MG disintegrating tablet Take 1 tablet (4 mg total) by mouth every 8 (eight) hours as needed. 07/01/20   Fredia Sorrow, MD  Pseudoephedrine-Ibuprofen 30-200 MG TABS Take by mouth every morning.    [provider]  terbinafine (LAMISIL) 250 MG tablet Take 1 tablet (250 mg total) by mouth daily. 01/23/19   Daleen Squibb, MD  zolpidem (AMBIEN) 10 MG tablet TAKE 1 TABLET BY MOUTH AT BEDTIME AS NEEDED FOR SLEEP 07/16/19   Jacelyn Pi, Lilia Argue, MD    Family History Family History  Problem Relation Age of Onset   Cancer Mother     Social History Social History   Tobacco Use   Smoking status: Never   Smokeless tobacco: Never     Allergies   Meloxicam   Review of Systems Review of Systems  Constitutional: Negative.   Respiratory: Negative.    Cardiovascular: Negative.   Gastrointestinal:  Positive for constipation. Negative for abdominal distention, abdominal pain, anal bleeding, blood in stool, diarrhea, nausea, rectal pain and vomiting.  Neurological: Negative.      Physical Exam Triage Vital Signs ED Triage Vitals  Enc Vitals Group     BP 10/16/21 1104 (!) 152/88  Pulse Rate 10/16/21 1104 77     Resp 10/16/21 1104 16     Temp 10/16/21 1104 98.3 F (36.8 C)     Temp Source 10/16/21 1104 Oral     SpO2 10/16/21 1104 97 %     Weight --      Height --      Head Circumference --      Peak Flow --      Pain Score 10/16/21 1105 0     Pain Loc --      Pain Edu? --      Excl. in [place]? --    No data found.  Updated Vital Signs BP (!) 152/88 (BP Location: Right Arm)   Pulse 77   Temp 98.3 F (36.8 C) (Oral)   Resp 16   SpO2 97%   Visual Acuity Right Eye Distance:   Left Eye Distance:   Bilateral Distance:    Right Eye Near:   Left Eye Near:    Bilateral Near:     Physical Exam Constitutional:      Appearance: Normal appearance.   HENT:     Head: Normocephalic.  Eyes:     Extraocular Movements: Extraocular movements intact.  Pulmonary:     Effort: Pulmonary effort is normal.  Skin:    General: Skin is warm and dry.  Neurological:     Mental Status: She is alert and oriented to person, place, and time. Mental status is at baseline.  Psychiatric:        Mood and Affect: Mood normal.        Behavior: Behavior normal.      UC Treatments / Results  Labs (all labs ordered are listed, but only abnormal results are displayed) Labs Reviewed - No data to display  EKG   Radiology No results found.  Procedures Procedures (including critical care time)  Medications Ordered in UC Medications - No data to display  Initial Impression / Assessment and Plan / UC Course  I have reviewed the triage vital signs and the nursing notes.  Pertinent labs & imaging results that were available during my care of the patient were reviewed by me and considered in my medical decision making (see chart for details).  Insomnia, constipation, encounter for medication refill  Medication refilled for 2 months, may follow-up with urgent care as needed, PCP referral placed to help patient find a new doctor Final Clinical Impressions(s) / UC Diagnoses   Final diagnoses:  None   Discharge Instructions   None    ED Prescriptions   None    I have reviewed the PDMP during this encounter.   Hans Eden, NP 10/16/21 1130

## 2021-10-16 NOTE — ED Triage Notes (Signed)
Pt states she needs a refill on her medications. Ambien and Linzess.

## 2021-10-16 NOTE — Discharge Instructions (Signed)
Medications have been refilled for 2 months, you may return urgent care as needed  A primary care referral has been placed for you to help you find a new doctor, ideally someone will reach out to you in 1 to 2 weeks

## 2021-11-04 IMAGING — CT CT ABD-PELV W/ CM
2 of 5 series · 15 of 46 positions shown, 17 images · IV contrast (Omnipaque)
Comparison: None.

CLINICAL DATA: Right lower quadrant abdominal pain.

EXAM:
CT ABDOMEN AND PELVIS WITH CONTRAST
TECHNIQUE: Multidetector CT imaging of the abdomen and pelvis was performed
using the standard protocol following bolus administration of
intravenous contrast.
CONTRAST:  100mL OMNIPAQUE IOHEXOL 300 MG/ML  SOLN

[Series 2: axial st · axial · 0.91mm/px · z∈[-506,-91]mm · 12 of 93 slices shown, 14 images]
[im 5/93  soft-tissue]
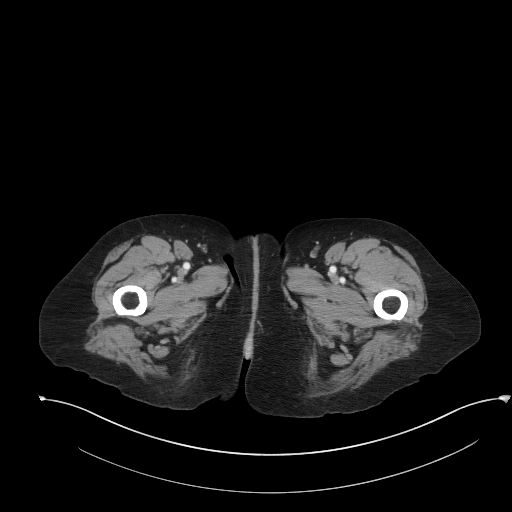
[im 5/93  bone]
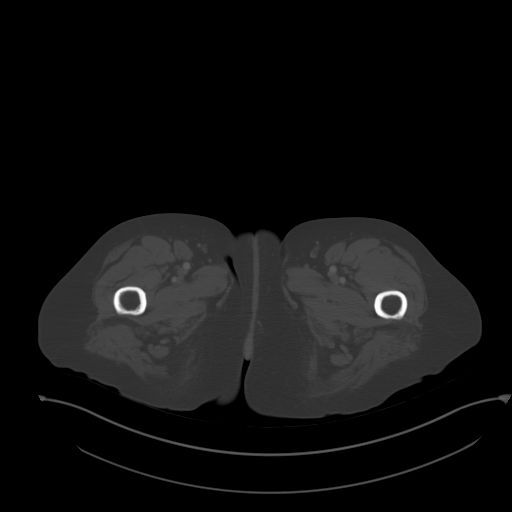
[im 15/93  soft-tissue]
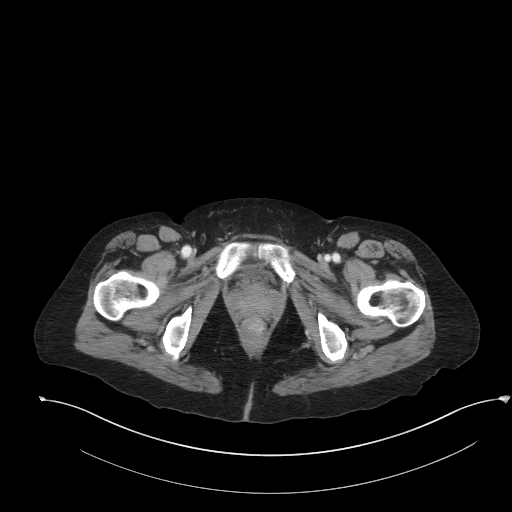
[im 20/93  soft-tissue]
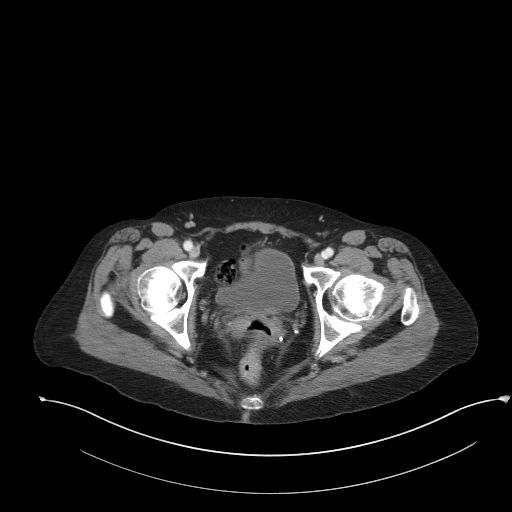
[im 30/93  soft-tissue]
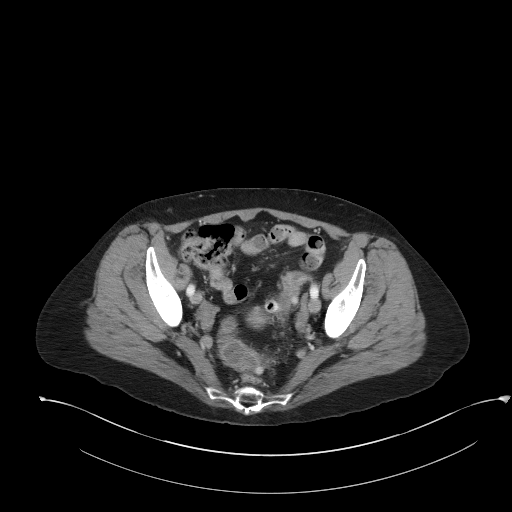
[im 34/93  soft-tissue]
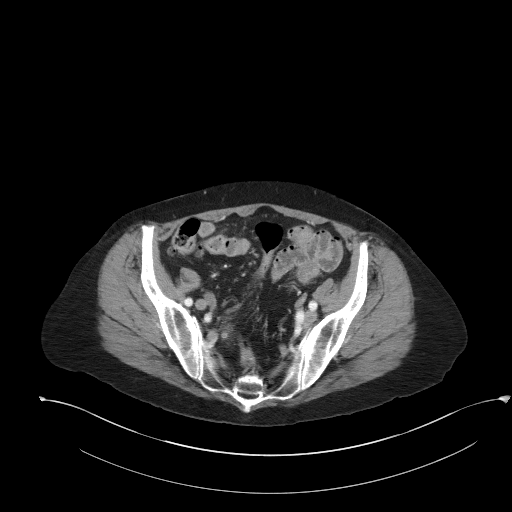
[im 44/93  soft-tissue]
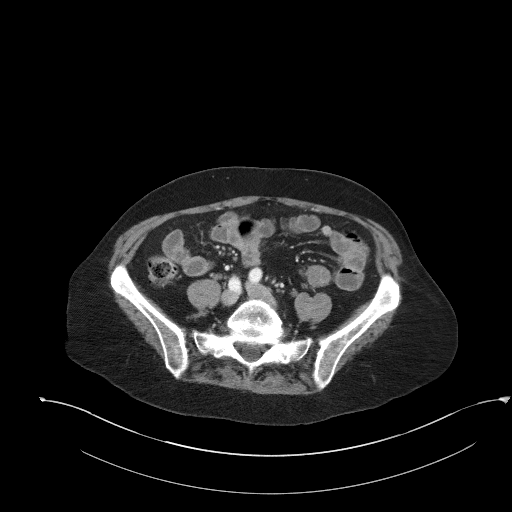
[im 49/93  soft-tissue]
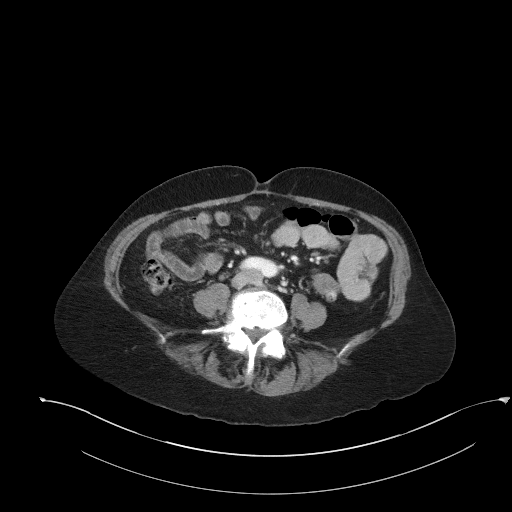
[im 59/93  soft-tissue]
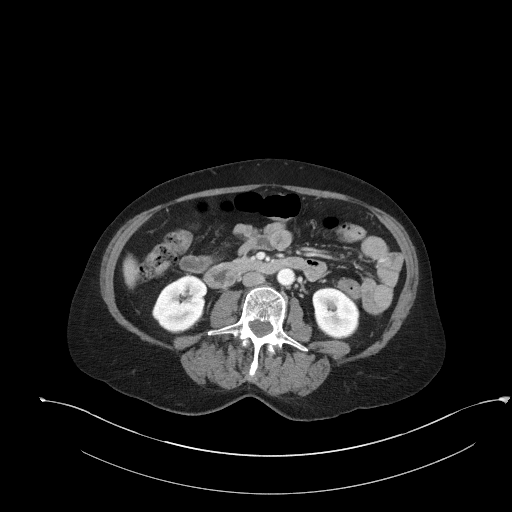
[im 63/93  soft-tissue]
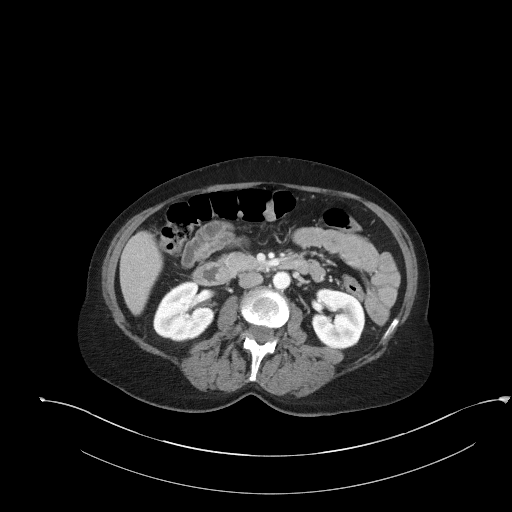
[im 63/93  bone]
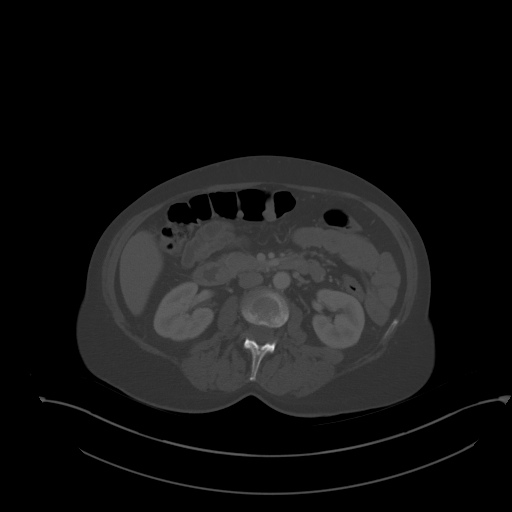
[im 73/93  soft-tissue]
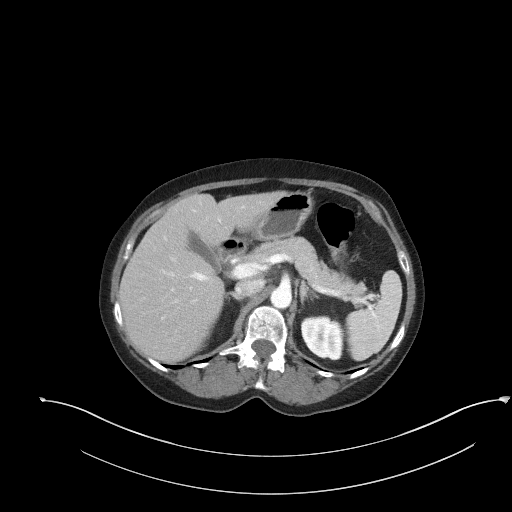
[im 78/93  soft-tissue]
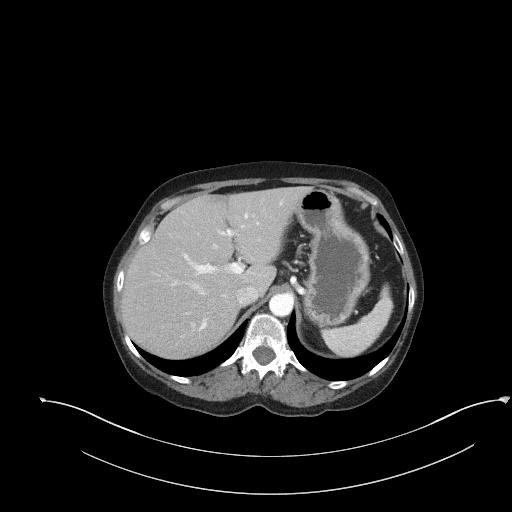
[im 88/93  soft-tissue]
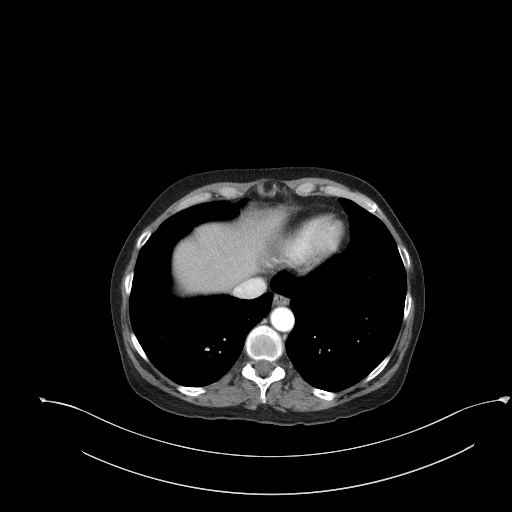

[Series 5: coronal st · coronal · 0.68mm/px · 3 of 83 slices shown]
[im 28/83  soft-tissue]
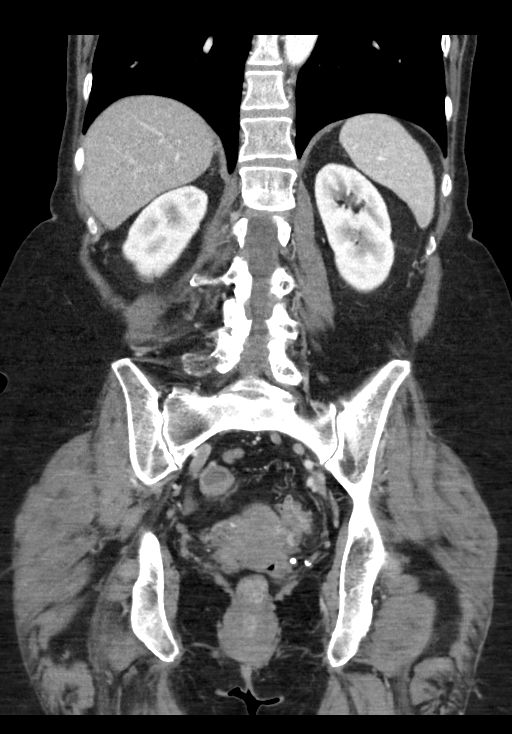
[im 37/83  soft-tissue]
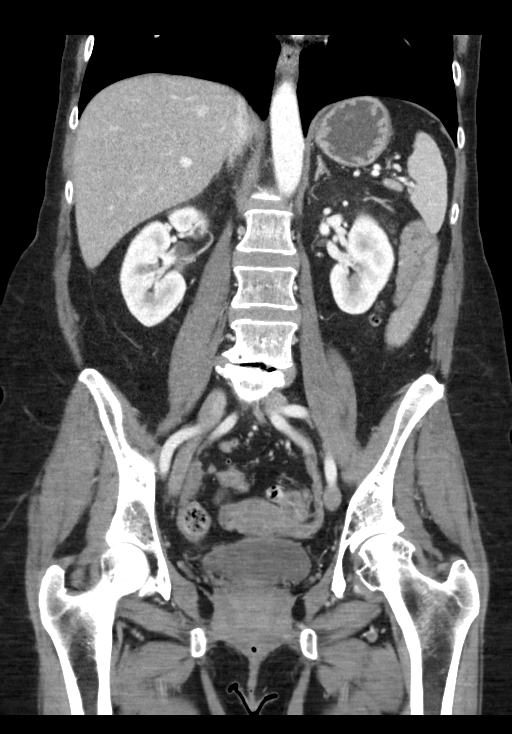
[im 46/83  soft-tissue]
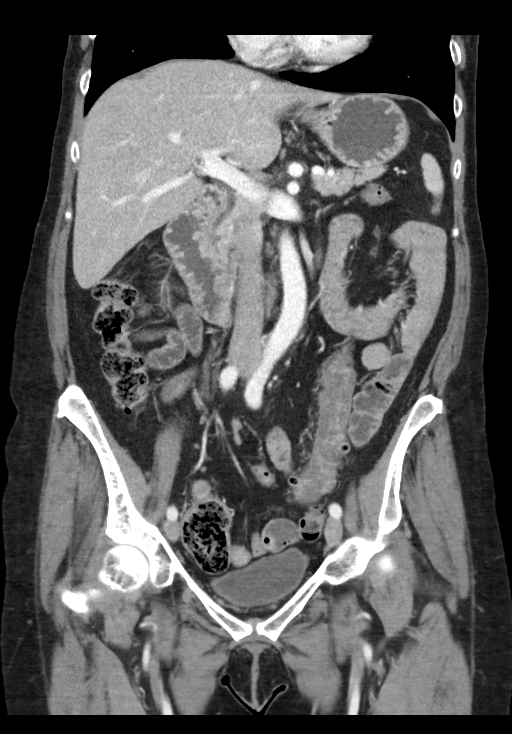

[15 of 46 positions shown; findings below may reference images not displayed]

FINDINGS: Lower chest: Unremarkable.

Hepatobiliary: 3 mm hypodensity in the inferior right liver is too
small to characterize but most likely benign. There is no evidence
for gallstones, gallbladder wall thickening, or pericholecystic
fluid. No intrahepatic or extrahepatic biliary dilation.

Pancreas: No focal mass lesion. No dilatation of the main duct. No
intraparenchymal cyst. No peripancreatic edema.

Spleen: No splenomegaly. No focal mass lesion.

Adrenals/Urinary Tract: No adrenal nodule or mass. Kidneys
unremarkable. No evidence for hydroureter. The urinary bladder
appears normal for the degree of distention.

Stomach/Bowel: Stomach is unremarkable. No gastric wall thickening.
No evidence of outlet obstruction. Duodenum is normally positioned
as is the ligament of Treitz. No small bowel wall thickening. No
small bowel dilatation. The terminal ileum is normal. The appendix
is not well visualized, but there is no edema or inflammation in the
region of the cecum. Wall thickening noted in the descending and
sigmoid colon with subtle pericolonic edema/inflammation (for
example see image 51/axial series 2). Diverticular disease noted in
the left colon.

Vascular/Lymphatic: No abdominal aortic aneurysm. No abdominal
lymphadenopathy. No pelvic lymphadenopathy

Reproductive: Unremarkable.

Other: No intraperitoneal free fluid.

Musculoskeletal: No worrisome lytic or sclerotic osseous
abnormality. Degenerative disc disease noted L4-5 and L5-S1
IMPRESSION: 1. Circumferential wall thickening in the descending and sigmoid
colon with subtle pericolonic edema/inflammation. Imaging features
compatible with infectious/inflammatory colitis.
2. Left colonic diverticulosis.

## 2022-09-09 ENCOUNTER — Ambulatory Visit (HOSPITAL_COMMUNITY)
Admission: RE | Admit: 2022-09-09 | Discharge: 2022-09-09 | Disposition: A | Discharge status: Home / Self Care | Payer: BC Managed Care – PPO | Source: Ambulatory Visit | Attending: Emergency Medicine | Admitting: Emergency Medicine

## 2022-09-09 ENCOUNTER — Encounter (HOSPITAL_COMMUNITY): Payer: Self-pay

## 2022-09-09 VITALS — BP 161/109 | HR 79 | Temp 98.5°F | Resp 18

## 2022-09-09 DIAGNOSIS — G4700 Insomnia, unspecified: Secondary | ICD-10-CM | POA: Diagnosis not present

## 2022-09-09 DIAGNOSIS — F5101 Primary insomnia: Secondary | ICD-10-CM

## 2022-09-09 DIAGNOSIS — R030 Elevated blood-pressure reading, without diagnosis of hypertension: Secondary | ICD-10-CM

## 2022-09-09 MED ORDER — ZOLPIDEM TARTRATE 10 MG PO TABS: ORAL_TABLET | ORAL | 0 refills | Status: AC

## 2022-09-09 NOTE — ED Triage Notes (Signed)
Pt states she has a PCP appt with a new PCP on 10/21/2022 but she needs a refill of her Ambien until her appt. She has been out for a few days. She says she breaks them in small pieces she doesn't take a whole one.

## 2022-09-09 NOTE — Discharge Instructions (Addendum)
Try to return to exercising regularly at [facility]. While there, try to get your BP taken at least once weekly. Keep track of those readings and take them to your new PCP appointment in July.

## 2022-09-09 NOTE — ED Provider Notes (Signed)
[facility]    CSN: [id number] Arrival date & time: 09/09/22  1018      History   Chief Complaint Chief Complaint  Patient presents with   Medication Refill    HPI Diane Dean is a 62 y.o. female.  Patient is currently in between PCPs and needs a refill of her Ambien until her new PCP appointment on 10/21/2022.  Prior PCP last filled Ambien 08/06/2022- I reviewed this in PDMP.  Rarely uses a full dose of Ambien and does not take every night.  Blood pressure upon arrival was elevated.  Patient reports has been intermittently high in the past depending on her stress level.  She reports a PCP a number of years ago tried her on a diuretic but she did not like the way it felt and she stopped it.  She does not monitor her blood pressure at home.  Chest pain or shortness of breath, peripheral edema, or dizziness.  She does do yard work but has stopped exercising regularly.  Has a membership to [employer] but has not been in a while.   Medication Refill   Past Medical History:  Diagnosis Date   Allergy    Arthritis    Herpes zoster 06/10/2017   Insomnia     Patient Active Problem List   Diagnosis Date Noted   Primary insomnia 09/10/2017   Environmental and seasonal allergies 09/10/2017   Chronic constipation 02/18/2016   Family history of colon cancer in mother 02/18/2016   Degenerative disc disease, lumbar 02/18/2016   Arthritis 02/18/2016   Serrated adenoma of colon 07/13/2014    Past Surgical History:  Procedure Laterality Date   BREAST SURGERY Bilateral    breast implants   TUBAL LIGATION      OB History   No obstetric history on file.      Home Medications    Prior to Admission medications   Medication Sig Start Date End Date Taking? Authorizing Provider  HYDROmorphone (DILAUDID) 2 MG tablet Take 1 tablet (2 mg total) by mouth every 6 (six) hours as needed for moderate pain or severe pain. 07/01/20   Vanetta Mulders, MD  linaclotide [facility])  145 MCG CAPS capsule Take 1 capsule (145 mcg total) by mouth daily before breakfast. 10/16/21   Valinda Hoar, NP  mupirocin ointment (BACTROBAN) 2 % Apply 1 application topically 3 (three) times daily. 01/12/19   Lezlie Lye, Meda Coffee, MD  nystatin (MYCOSTATIN) 100000 UNIT/ML suspension Take 5 mLs (500,000 Units total) by mouth 4 (four) times daily. 01/23/19   Lezlie Lye, Meda Coffee, MD  Olopatadine HCl 0.2 % SOLN INSTILL 1 DROP INTO AFFECTED EYE EVERY DAY 07/04/18   Collie Siad A, MD  ondansetron (ZOFRAN ODT) 4 MG disintegrating tablet Take 1 tablet (4 mg total) by mouth every 8 (eight) hours as needed. 07/01/20   Vanetta Mulders, MD  Pseudoephedrine-Ibuprofen 30-200 MG TABS Take by mouth every morning.    [provider]  terbinafine (LAMISIL) 250 MG tablet Take 1 tablet (250 mg total) by mouth daily. 01/23/19   Noni Saupe, MD  zolpidem (AMBIEN) 10 MG tablet TAKE 1 TABLET BY MOUTH AT BEDTIME AS NEEDED FOR SLEEP 09/09/22   Cathlyn Parsons, NP    Family History Family History  Problem Relation Age of Onset   Cancer Mother     Social History Social History   Tobacco Use   Smoking status: Never   Smokeless tobacco: Never  Vaping Use  Vaping Use: Never used  Substance Use Topics   Alcohol use: Yes   Drug use: Never     Allergies   Meloxicam   Review of Systems Review of Systems   Physical Exam Triage Vital Signs ED Triage Vitals  Enc Vitals Group     BP 09/09/22 1043 (!) 161/109     Pulse Rate 09/09/22 1043 79     Resp 09/09/22 1043 18     Temp 09/09/22 1043 98.5 F (36.9 C)     Temp Source 09/09/22 1043 Oral     SpO2 09/09/22 1043 97 %     Weight --      Height --      Head Circumference --      Peak Flow --      Pain Score 09/09/22 1040 0     Pain Loc --      Pain Edu? --      Excl. in GC? --    No data found.  Updated Vital Signs BP (!) 161/109 (BP Location: Left Arm)   Pulse 79   Temp 98.5 F (36.9 C) (Oral)   Resp 18   SpO2  97%   Visual Acuity Right Eye Distance:   Left Eye Distance:   Bilateral Distance:    Right Eye Near:   Left Eye Near:    Bilateral Near:     Physical Exam Constitutional:      General: She is not in acute distress.    Appearance: Normal appearance. She is ill-appearing.     Comments: Animated and talkative  Cardiovascular:     Rate and Rhythm: Normal rate and regular rhythm.     Comments: No carotid bruits Pulmonary:     Effort: Pulmonary effort is normal.     Breath sounds: Normal breath sounds.  Musculoskeletal:     Right lower leg: No edema.     Left lower leg: No edema.  Neurological:     Mental Status: She is alert.      UC Treatments / Results  Labs (all labs ordered are listed, but only abnormal results are displayed) Labs Reviewed - No data to display  EKG   Radiology No results found.  Procedures Procedures (including critical care time)  Medications Ordered in UC Medications - No data to display  Initial Impression / Assessment and Plan / UC Course  I have reviewed the triage vital signs and the nursing notes.  Pertinent labs & imaging results that were available during my care of the patient were reviewed by me and considered in my medical decision making (see chart for details).    Discussed stress management including exercise and meditation.  Patient agrees she will monitor her blood pressure at least weekly and take readings to new PCP appointment in July.  She will resume exercise  Final Clinical Impressions(s) / UC Diagnoses   Final diagnoses:  Insomnia, unspecified type  Elevated blood pressure reading     Discharge Instructions      Try to return to exercising regularly at [facility]. While there, try to get your BP taken at least once weekly. Keep track of those readings and take them to your new PCP appointment in July.    ED Prescriptions     Medication Sig Dispense Auth. Provider   zolpidem (AMBIEN) 10 MG tablet TAKE 1  TABLET BY MOUTH AT BEDTIME AS NEEDED FOR SLEEP 30 tablet Cathlyn Parsons, NP      I have reviewed  the PDMP during this encounter.   Cathlyn Parsons, NP 09/09/22 1126

## 2022-12-07 ENCOUNTER — Telehealth: Payer: BC Managed Care – PPO | Admitting: Physician Assistant

## 2022-12-07 DIAGNOSIS — B029 Zoster without complications: Secondary | ICD-10-CM | POA: Diagnosis not present

## 2022-12-07 MED ORDER — OLOPATADINE HCL 0.2 % OP SOLN: 1.0000 [drp] | Freq: Every day | OPHTHALMIC | 0 refills | Status: AC

## 2022-12-07 MED ORDER — VALACYCLOVIR HCL 1 G PO TABS: 1000.0000 mg | ORAL_TABLET | Freq: Three times a day (TID) | ORAL | 0 refills | Status: AC

## 2022-12-07 NOTE — Patient Instructions (Signed)
Genella Rife, thank you for joining Piedad Climes, PA-C for today's virtual visit.  While this provider is not your primary care provider (PCP), if your PCP is located in our provider database this encounter information will be shared with them immediately following your visit.   A [place] [facility] MyChart account gives you access to today's visit and all your visits, tests, and labs performed at [facility] " click here if you don't have a [facility] MyChart account or go to [email or web address]  Consent: (Patient) NAYLANIE MARENTES provided verbal consent for this virtual visit at the beginning of the encounter.  Current Medications:  Current Outpatient Medications:    HYDROmorphone (DILAUDID) 2 MG tablet, Take 1 tablet (2 mg total) by mouth every 6 (six) hours as needed for moderate pain or severe pain., Disp: 20 tablet, Rfl: 0   linaclotide (LINZESS) 145 MCG CAPS capsule, Take 1 capsule (145 mcg total) by mouth daily before breakfast., Disp: 30 capsule, Rfl: 1   mupirocin ointment (BACTROBAN) 2 %, Apply 1 application topically 3 (three) times daily., Disp: 22 g, Rfl: 1   nystatin (MYCOSTATIN) 100000 UNIT/ML suspension, Take 5 mLs (500,000 Units total) by mouth 4 (four) times daily., Disp: 60 mL, Rfl: 1   Olopatadine HCl 0.2 % SOLN, INSTILL 1 DROP INTO AFFECTED EYE EVERY DAY, Disp: 2.5 mL, Rfl: 0   ondansetron (ZOFRAN ODT) 4 MG disintegrating tablet, Take 1 tablet (4 mg total) by mouth every 8 (eight) hours as needed., Disp: 10 tablet, Rfl: 1   Pseudoephedrine-Ibuprofen 30-200 MG TABS, Take by mouth every morning., Disp: , Rfl:    terbinafine (LAMISIL) 250 MG tablet, Take 1 tablet (250 mg total) by mouth daily., Disp: 30 tablet, Rfl: 2   zolpidem (AMBIEN) 10 MG tablet, TAKE 1 TABLET BY MOUTH AT BEDTIME AS NEEDED FOR SLEEP, Disp: 30 tablet, Rfl: 0   Medications ordered in this encounter:  No orders of the defined types were placed in this encounter.    *If you need  refills on other medications prior to your next appointment, please contact your pharmacy*  Follow-Up: Call back or seek an in-person evaluation if the symptoms worsen or if the condition fails to improve as anticipated.  [facility] [phone]  Other Instructions Shingles  Shingles is an infection. It gives you a painful skin rash and blisters that have fluid in them. Shingles is caused by the same germ (virus) that causes chickenpox. Shingles only happens in people who: Have had chickenpox. Have been given a shot (vaccine) to protect against chickenpox. Shingles is rare in this group. What are the causes? This condition is caused by varicella-zoster virus. This is the same germ that causes chickenpox. After a person is exposed to the germ, the germ stays in the body but is not active (dormant). Shingles develops if the germ becomes active again (is reactivated). This can happen many years after the first exposure to the germ. It is not known what causes this germ to become active again. What increases the risk? People who have had chickenpox or received the chickenpox shot are at risk for shingles. This infection is more common in people who: Are older than 63 years of age. Have a weakened disease-fighting system (immune system), such as people with: HIV (human immunodeficiency virus). AIDS (acquired immunodeficiency syndrome). Cancer. Are taking medicines that weaken the immune system, such as organ transplant medicines. Have a lot of stress. What are the signs or symptoms? The first  symptoms of shingles may be itching, tingling, or pain in an area on your skin. A rash will show on your skin a few days or weeks later. This is what usually happens: The rash is likely to be on one side of your body. The rash usually has a shape like a belt or a band. Over time, the rash turns into fluid-filled blisters. The blisters will break open and change into scabs. The scabs  usually dry up in about 2-3 weeks. You may also have: A fever. Chills. A headache. A feeling like you may vomit (nausea). How is this treated? The rash may last for several weeks. There is not a specific cure for this condition. Your doctor may prescribe medicines. Medicines may: Help with pain. Help you get better sooner. Help to prevent long-term problems. Help with itching (antihistamines). If the area involved is on your face, you may need to see a specialist. This may be an eye doctor or an ear, nose, and throat (ENT) doctor. Follow these instructions at home: Medicines Take over-the-counter and prescription medicines only as told by your doctor. Put on an anti-itch cream or numbing cream where you have a rash, blisters, or scabs. Do this as told by your doctor. Helping with itching and discomfort  Put cold, wet cloths (cold compresses) on the area of the rash or blisters as told by your doctor. Cool baths can help you feel better. Try adding baking soda or dry oatmeal to the water to lessen itching. Do not bathe in hot water. Use calamine lotion as told by your doctor. Blister and rash care Keep your rash covered with a loose bandage (dressing). Wear loose clothing that does not rub on your rash. Wash your hands with soap and water for at least 20 seconds before and after you change your bandage. If you cannot use soap and water, use hand sanitizer. Change your bandage as told by your doctor. Keep your rash and blisters clean. To do this, wash the area with mild soap and cool water as told by your doctor. Check your rash every day for signs of infection. Check for: More redness, swelling, or pain. Fluid or blood. Warmth. Pus or a bad smell. Do not scratch your rash. Do not pick at your blisters. To help you to not scratch: Keep your fingernails clean and cut short. Wear gloves or mittens when you sleep, if scratching is a problem. General instructions Rest as told by your  doctor. Wash your hands often with soap and water for at least 20 seconds. If you cannot use soap and water, use hand sanitizer. Doing this lowers your chance of getting a skin infection. Your infection can cause chickenpox in people who have never had chickenpox or never got a chickenpox vaccine shot. If you have blisters that did not change into scabs yet, try not to touch other people or be around other people, especially: Babies. Pregnant women. Children who have areas of red, itchy, or rough skin (eczema). Older people who have organ transplants. People who have a long-term (chronic) illness, like cancer or AIDS. Keep all follow-up visits. How is this prevented? A vaccine shot is the best way to prevent shingles and protect against shingles problems. If you have not had a vaccine shot, talk with your doctor about getting it. Where to find more information Centers for Disease Control and Prevention: [employer]Exhibition.com.br Contact a doctor if: Your pain does not get better with medicine. Your pain does not get better after  the rash heals. You have any of these signs of infection around the rash: More redness, swelling, or pain. Fluid or blood. Warmth. Pus or a bad smell. You have a fever. Get help right away if: The rash is on your face or nose. You have pain in your face or pain by your eye. You lose feeling on one side of your face. You have trouble seeing. You have ear pain, or you have ringing in your ear. You have a loss of taste. Your condition gets worse. Summary Shingles gives you a painful skin rash and blisters that have fluid in them. Shingles is caused by the same germ (virus) that causes chickenpox. Keep your rash covered with a loose bandage. Wear loose clothing that does not rub on your rash. If you have blisters that did not change into scabs yet, try not to touch other people or be around people. This information is not intended to replace advice given to you by your  health care provider. Make sure you discuss any questions you have with your health care provider. Document Revised: 03/10/2020 Document Reviewed: 03/10/2020 Elsevier Patient Education  2024 Elsevier Inc.    If you have been instructed to have an in-person evaluation today at a local Urgent Care facility, please use the link below. It will take you to a list of all of our available [facility], including address, phone number and hours of operation. Please do not delay care.  [facility]  If you or a family member do not have a primary care provider, use the link below to schedule a visit and establish care. When you choose a [facility] primary care physician or advanced practice provider, you gain a long-term partner in health. Find a Primary Care Provider  Learn more about [facility] in-office and virtual care options: [facility] - Get Care Now

## 2022-12-07 NOTE — Progress Notes (Signed)
Virtual Visit Consent   Diane Dean, you are scheduled for a virtual visit with a [facility] provider today. Just as with appointments in the office, your consent must be obtained to participate. Your consent will be active for this visit and any virtual visit you may have with one of our providers in the next 365 days. If you have a MyChart account, a copy of this consent can be sent to you electronically.  As this is a virtual visit, video technology does not allow for your provider to perform a traditional examination. This may limit your provider's ability to fully assess your condition. If your provider identifies any concerns that need to be evaluated in person or the need to arrange testing (such as labs, EKG, etc.), we will make arrangements to do so. Although advances in technology are sophisticated, we cannot ensure that it will always work on either your end or our end. If the connection with a video visit is poor, the visit may have to be switched to a telephone visit. With either a video or telephone visit, we are not always able to ensure that we have a secure connection.  By engaging in this virtual visit, you consent to the provision of healthcare and authorize for your insurance to be billed (if applicable) for the services provided during this visit. Depending on your insurance coverage, you may receive a charge related to this service.  I need to obtain your verbal consent now. Are you willing to proceed with your visit today? [name] has provided verbal consent on 12/07/2022 for a virtual visit (video or telephone). Diane Dean, [place]  Date: 12/07/2022 7:55 AM  Virtual Visit via Video Note   I, Diane Dean, connected with  Diane Dean  ([id number], 1961-03-29) on 12/07/22 at  8:00 AM EDT by a video-enabled telemedicine application and verified that I am speaking with the correct person using two identifiers.  Location: Patient: Virtual Visit  Location Patient: Home Provider: Virtual Visit Location Provider: Home Office   I discussed the limitations of evaluation and management by telemedicine and the availability of in person appointments. The patient expressed understanding and agreed to proceed.    History of Present Illness: Diane Dean is a 62 y.o. who identifies as a female who was assigned female at birth, and is being seen today for possible shingles rash. Notes she woke up at [facility] Friday night with pain/burning of R forearm. No rash at the time. Saturday red raised spots of forearm. Friend told was maybe ringworm so she started with Lotrimin.  Itches and burns. Then noting pins and needles up the arm. This morning woke up with some newer areas trying to break through.Feels run down. Denies fever. Noting increased stressors with work. Has had shingles before on her neck region and this feels similar.  HPI: HPI  Problems:  Patient Active Problem List   Diagnosis Date Noted   Primary insomnia 09/10/2017   Environmental and seasonal allergies 09/10/2017   Chronic constipation 02/18/2016   Family history of colon cancer in mother 02/18/2016   Degenerative disc disease, lumbar 02/18/2016   Arthritis 02/18/2016   Serrated adenoma of colon 07/13/2014    Allergies:  Allergies  Allergen Reactions   Meloxicam    Medications:  Current Outpatient Medications:    linaclotide (LINZESS) 145 MCG CAPS capsule, Take 1 capsule (145 mcg total) by mouth daily before breakfast., Disp: 30 capsule, Rfl: 1   mupirocin ointment (BACTROBAN)  2 %, Apply 1 application topically 3 (three) times daily., Disp: 22 g, Rfl: 1   Olopatadine HCl 0.2 % SOLN, INSTILL 1 DROP INTO AFFECTED EYE EVERY DAY, Disp: 2.5 mL, Rfl: 0   Pseudoephedrine-Ibuprofen 30-200 MG TABS, Take by mouth every morning., Disp: , Rfl:    zolpidem (AMBIEN) 10 MG tablet, TAKE 1 TABLET BY MOUTH AT BEDTIME AS NEEDED FOR SLEEP, Disp: 30 tablet, Rfl:  0  Observations/Objective: Patient is well-developed, well-nourished in no acute distress.  Resting comfortably at home.  Head is normocephalic, atraumatic.  No labored breathing. Speech is clear and coherent with logical content.  Patient is alert and oriented at baseline.  Vesicular lesions, erythematous of her R anterior forearm. Smaller lesions up the arm from this. No noted rash elsewhere.  Assessment and Plan: 1. Herpes zoster without complication  Suspected shingles. Supportive measures and OTC medications reviewed. Shingles per orders.  Follow Up Instructions: I discussed the assessment and treatment plan with the patient. The patient was provided an opportunity to ask questions and all were answered. The patient agreed with the plan and demonstrated an understanding of the instructions.  A copy of instructions were sent to the patient via MyChart unless otherwise noted below.   The patient was advised to call back or seek an in-person evaluation if the symptoms worsen or if the condition fails to improve as anticipated.  Time:  I spent 10 minutes with the patient via telehealth technology discussing the above problems/concerns.    Diane Climes, PA-C

## 2023-02-07 ENCOUNTER — Other Ambulatory Visit: Payer: Self-pay

## 2023-03-08 ENCOUNTER — Other Ambulatory Visit: Payer: Self-pay

## 2023-05-02 ENCOUNTER — Other Ambulatory Visit: Payer: Self-pay

## 2023-08-12 ENCOUNTER — Other Ambulatory Visit: Payer: Self-pay
